# Patient Record
Sex: Female | Born: 1935 | Race: White | Hispanic: No | Smoking: Former smoker
Health system: Southern US, Community
[De-identification: ages and names within clinical notes are randomized; demographics above are authoritative.]

## PROBLEM LIST (undated history)

## (undated) DIAGNOSIS — F0390 Unspecified dementia without behavioral disturbance: Secondary | ICD-10-CM

## (undated) DIAGNOSIS — C50919 Malignant neoplasm of unspecified site of unspecified female breast: Secondary | ICD-10-CM

## (undated) DIAGNOSIS — B1910 Unspecified viral hepatitis B without hepatic coma: Secondary | ICD-10-CM

## (undated) DIAGNOSIS — E039 Hypothyroidism, unspecified: Secondary | ICD-10-CM

## (undated) DIAGNOSIS — E785 Hyperlipidemia, unspecified: Secondary | ICD-10-CM

## (undated) DIAGNOSIS — S32000A Wedge compression fracture of unspecified lumbar vertebra, initial encounter for closed fracture: Secondary | ICD-10-CM

## (undated) DIAGNOSIS — G459 Transient cerebral ischemic attack, unspecified: Secondary | ICD-10-CM

## (undated) DIAGNOSIS — J449 Chronic obstructive pulmonary disease, unspecified: Secondary | ICD-10-CM

## (undated) DIAGNOSIS — E079 Disorder of thyroid, unspecified: Secondary | ICD-10-CM

## (undated) HISTORY — PX: WRIST SURGERY: SHX841

## (undated) HISTORY — PX: APPENDECTOMY: SHX54

## (undated) HISTORY — PX: MASTECTOMY: SHX3

## (undated) SURGERY — Surgical Case
Anesthesia: *Unknown

---

## 1998-07-21 ENCOUNTER — Other Ambulatory Visit: Admission: RE | Admit: 1998-07-21 | Discharge: 1998-07-21 | Payer: Self-pay | Admitting: *Deleted

## 1998-08-18 ENCOUNTER — Ambulatory Visit (HOSPITAL_COMMUNITY): Admission: RE | Admit: 1998-08-18 | Discharge: 1998-08-18 | Payer: Self-pay | Admitting: *Deleted

## 1998-10-03 ENCOUNTER — Ambulatory Visit (HOSPITAL_BASED_OUTPATIENT_CLINIC_OR_DEPARTMENT_OTHER): Admission: RE | Admit: 1998-10-03 | Discharge: 1998-10-03 | Payer: Self-pay | Admitting: Ophthalmology

## 1999-07-19 ENCOUNTER — Other Ambulatory Visit: Admission: RE | Admit: 1999-07-19 | Discharge: 1999-07-19 | Payer: Self-pay | Admitting: *Deleted

## 2000-07-25 ENCOUNTER — Ambulatory Visit (HOSPITAL_COMMUNITY): Admission: RE | Admit: 2000-07-25 | Discharge: 2000-07-25 | Payer: Self-pay | Admitting: Endocrinology

## 2000-08-07 ENCOUNTER — Other Ambulatory Visit: Admission: RE | Admit: 2000-08-07 | Discharge: 2000-08-07 | Payer: Self-pay | Admitting: *Deleted

## 2001-08-19 ENCOUNTER — Other Ambulatory Visit: Admission: RE | Admit: 2001-08-19 | Discharge: 2001-08-19 | Payer: Self-pay | Admitting: *Deleted

## 2003-03-15 ENCOUNTER — Other Ambulatory Visit: Admission: RE | Admit: 2003-03-15 | Discharge: 2003-03-15 | Payer: Self-pay | Admitting: Obstetrics and Gynecology

## 2009-05-30 ENCOUNTER — Encounter: Admission: RE | Admit: 2009-05-30 | Discharge: 2009-05-30 | Payer: Self-pay | Admitting: Endocrinology

## 2009-11-19 ENCOUNTER — Emergency Department (HOSPITAL_BASED_OUTPATIENT_CLINIC_OR_DEPARTMENT_OTHER): Admission: EM | Admit: 2009-11-19 | Discharge: 2009-11-19 | Payer: Self-pay | Admitting: Emergency Medicine

## 2009-11-19 ENCOUNTER — Ambulatory Visit: Payer: Self-pay | Admitting: Diagnostic Radiology

## 2010-05-24 ENCOUNTER — Ambulatory Visit: Payer: Self-pay | Admitting: Diagnostic Radiology

## 2010-05-24 ENCOUNTER — Emergency Department (HOSPITAL_BASED_OUTPATIENT_CLINIC_OR_DEPARTMENT_OTHER): Admission: EM | Admit: 2010-05-24 | Discharge: 2010-05-24 | Payer: Self-pay | Admitting: Emergency Medicine

## 2010-06-07 ENCOUNTER — Inpatient Hospital Stay (HOSPITAL_COMMUNITY): Admission: EM | Admit: 2010-06-07 | Discharge: 2010-06-15 | Payer: Self-pay | Admitting: Emergency Medicine

## 2011-01-12 LAB — URINE MICROSCOPIC-ADD ON

## 2011-01-12 LAB — APTT: aPTT: 23 seconds — ABNORMAL LOW (ref 24–37)

## 2011-01-12 LAB — CBC
Hemoglobin: 13.1 g/dL (ref 12.0–15.0)
MCH: 31.4 pg (ref 26.0–34.0)
MCV: 93.7 fL (ref 78.0–100.0)
Platelets: 166 10*3/uL (ref 150–400)
RBC: 3.79 MIL/uL — ABNORMAL LOW (ref 3.87–5.11)
RBC: 4.15 MIL/uL (ref 3.87–5.11)
RDW: 13.9 % (ref 11.5–15.5)
WBC: 11.6 10*3/uL — ABNORMAL HIGH (ref 4.0–10.5)
WBC: 6.9 10*3/uL (ref 4.0–10.5)

## 2011-01-12 LAB — COMPREHENSIVE METABOLIC PANEL
Albumin: 3.1 g/dL — ABNORMAL LOW (ref 3.5–5.2)
Alkaline Phosphatase: 53 U/L (ref 39–117)
Calcium: 8.5 mg/dL (ref 8.4–10.5)
Glucose, Bld: 89 mg/dL (ref 70–99)
Sodium: 144 mEq/L (ref 135–145)

## 2011-01-12 LAB — URINALYSIS, ROUTINE W REFLEX MICROSCOPIC
Hgb urine dipstick: NEGATIVE
Nitrite: NEGATIVE
Protein, ur: NEGATIVE mg/dL
Specific Gravity, Urine: 1.024 (ref 1.005–1.030)
Urobilinogen, UA: 1 mg/dL (ref 0.0–1.0)

## 2011-01-12 LAB — DIFFERENTIAL
Eosinophils Absolute: 0.1 10*3/uL (ref 0.0–0.7)
Eosinophils Relative: 1 % (ref 0–5)
Lymphocytes Relative: 11 % — ABNORMAL LOW (ref 12–46)
Lymphs Abs: 1.3 10*3/uL (ref 0.7–4.0)
Monocytes Absolute: 0.6 10*3/uL (ref 0.1–1.0)

## 2011-01-12 LAB — BASIC METABOLIC PANEL
BUN: 19 mg/dL (ref 6–23)
CO2: 23 mEq/L (ref 19–32)
Chloride: 110 mEq/L (ref 96–112)
Glucose, Bld: 107 mg/dL — ABNORMAL HIGH (ref 70–99)
Potassium: 3.7 mEq/L (ref 3.5–5.1)

## 2011-01-12 LAB — POCT CARDIAC MARKERS
CKMB, poc: <1 ng/mL — ABNORMAL LOW (ref 1.0–8.0)
Troponin i, poc: <0.05 ng/mL (ref 0.00–0.09)

## 2011-01-15 LAB — CBC
HCT: 41.2 % (ref 36.0–46.0)
Platelets: 161 10*3/uL (ref 150–400)
WBC: 11 10*3/uL — ABNORMAL HIGH (ref 4.0–10.5)

## 2011-01-15 LAB — DIFFERENTIAL
Basophils Absolute: 0.1 10*3/uL (ref 0.0–0.1)
Basophils Relative: 1 % (ref 0–1)
Eosinophils Absolute: 0 10*3/uL (ref 0.0–0.7)
Eosinophils Relative: 0 % (ref 0–5)
Monocytes Absolute: 0.2 10*3/uL (ref 0.1–1.0)

## 2011-01-15 LAB — URINALYSIS, ROUTINE W REFLEX MICROSCOPIC
Glucose, UA: NEGATIVE mg/dL
Hgb urine dipstick: NEGATIVE
Urobilinogen, UA: 1 mg/dL (ref 0.0–1.0)
pH: 6 (ref 5.0–8.0)

## 2011-01-15 LAB — COMPREHENSIVE METABOLIC PANEL
ALT: 29 U/L (ref 0–35)
Albumin: 4.4 g/dL (ref 3.5–5.2)
Alkaline Phosphatase: 54 U/L (ref 39–117)
BUN: 17 mg/dL (ref 6–23)
Chloride: 109 mEq/L (ref 96–112)
Potassium: 4.2 mEq/L (ref 3.5–5.1)
Sodium: 143 mEq/L (ref 135–145)
Total Bilirubin: 0.5 mg/dL (ref 0.3–1.2)
Total Protein: 7.3 g/dL (ref 6.0–8.3)

## 2011-01-15 LAB — LIPASE, BLOOD: Lipase: 87 U/L (ref 23–300)

## 2011-07-30 ENCOUNTER — Emergency Department (HOSPITAL_BASED_OUTPATIENT_CLINIC_OR_DEPARTMENT_OTHER)
Admission: EM | Admit: 2011-07-30 | Discharge: 2011-07-30 | Disposition: A | Discharge status: Home / Self Care | Payer: Medicare Other | Attending: Emergency Medicine | Admitting: Emergency Medicine

## 2011-07-30 ENCOUNTER — Emergency Department (INDEPENDENT_AMBULATORY_CARE_PROVIDER_SITE_OTHER): Payer: Medicare Other

## 2011-07-30 DIAGNOSIS — E079 Disorder of thyroid, unspecified: Secondary | ICD-10-CM | POA: Insufficient documentation

## 2011-07-30 DIAGNOSIS — S32009A Unspecified fracture of unspecified lumbar vertebra, initial encounter for closed fracture: Secondary | ICD-10-CM | POA: Insufficient documentation

## 2011-07-30 DIAGNOSIS — E785 Hyperlipidemia, unspecified: Secondary | ICD-10-CM | POA: Insufficient documentation

## 2011-07-30 DIAGNOSIS — F17200 Nicotine dependence, unspecified, uncomplicated: Secondary | ICD-10-CM | POA: Insufficient documentation

## 2011-07-30 DIAGNOSIS — M549 Dorsalgia, unspecified: Secondary | ICD-10-CM

## 2011-07-30 DIAGNOSIS — M949 Disorder of cartilage, unspecified: Secondary | ICD-10-CM

## 2011-07-30 DIAGNOSIS — Q7649 Other congenital malformations of spine, not associated with scoliosis: Secondary | ICD-10-CM

## 2011-07-30 DIAGNOSIS — M8440XA Pathological fracture, unspecified site, initial encounter for fracture: Secondary | ICD-10-CM

## 2011-07-30 DIAGNOSIS — X58XXXA Exposure to other specified factors, initial encounter: Secondary | ICD-10-CM | POA: Insufficient documentation

## 2011-07-30 DIAGNOSIS — S32030A Wedge compression fracture of third lumbar vertebra, initial encounter for closed fracture: Secondary | ICD-10-CM

## 2011-07-30 HISTORY — DX: Malignant neoplasm of unspecified site of unspecified female breast: C50.919

## 2011-07-30 HISTORY — DX: Disorder of thyroid, unspecified: E07.9

## 2011-07-30 HISTORY — DX: Hyperlipidemia, unspecified: E78.5

## 2011-07-30 LAB — BASIC METABOLIC PANEL
CO2: 27 mEq/L (ref 19–32)
Calcium: 9.7 mg/dL (ref 8.4–10.5)
Chloride: 105 mEq/L (ref 96–112)
Sodium: 142 mEq/L (ref 135–145)

## 2011-07-30 LAB — URINALYSIS, ROUTINE W REFLEX MICROSCOPIC
Glucose, UA: NEGATIVE mg/dL
Protein, ur: NEGATIVE mg/dL

## 2011-07-30 LAB — URINE MICROSCOPIC-ADD ON

## 2011-07-30 MED ORDER — ACETAMINOPHEN 325 MG PO TABS
650.0000 mg | ORAL_TABLET | Freq: Once | ORAL | Status: AC
  Administered 2011-07-30: 650 mg via ORAL
  Filled 2011-07-30: qty 2

## 2011-07-30 MED ORDER — HYDROCODONE-ACETAMINOPHEN 5-325 MG PO TABS: 1.0000 | ORAL_TABLET | Freq: Four times a day (QID) | ORAL | Status: AC | PRN

## 2011-07-30 NOTE — ED Notes (Signed)
Pt assisted to BR via w/c for urine specimen.

## 2011-07-30 NOTE — ED Notes (Signed)
MD at bedside. 

## 2011-07-30 NOTE — ED Notes (Signed)
Pt reports low back pain that started this am after awakening.

## 2011-07-30 NOTE — ED Provider Notes (Signed)
History     CSN: [id number] Arrival date & time: 07/30/2011  8:52 AM  Chief Complaint  Patient presents with  . Back Pain    (Consider location/radiation/quality/duration/timing/severity/associated sxs/prior treatment) HPI Complains of low back pain radiating to right flank onset this morning sharp in quality worse with movement improved with rest no other associated symptoms no loss of bladder or bowel control no fever no trauma Past Medical History  Diagnosis Date  . Hyperlipemia   . Thyroid disease   . Breast cancer   . Kidney stones     Past Surgical History  Procedure Date  . Mastectomy     No family history on file.  History  Substance Use Topics  . Smoking status: Current Everyday Smoker -- 1.0 packs/day  . Smokeless tobacco: Never Used  . Alcohol Use: No   retired Engineer, civil (consulting)  OB History    Grav Para Term Preterm Abortions TAB SAB Ect Mult Living                  Review of Systems  Constitutional: Negative.   HENT: Negative.   Respiratory: Negative.   Cardiovascular: Negative.   Gastrointestinal: Negative.   Musculoskeletal: Positive for back pain.       Walks with walker  Skin: Negative.   Neurological: Negative.   Hematological: Negative.   Psychiatric/Behavioral: Negative.     Allergies  Codeine  Home Medications   Current Outpatient Rx  Name Route Sig Dispense Refill  . LEVOTHYROXINE SODIUM 100 MCG PO TABS Oral Take by mouth daily.      Marland Kitchen SIMVASTATIN 40 MG PO TABS Oral Take 40 mg by mouth at bedtime.        BP 112/66  Pulse 71  Temp(Src) 98.3 F (36.8 C) (Oral)  Resp 16  Ht 5\' 2"  (1.575 m)  Wt 110 lb (49.896 kg)  BMI 20.12 kg/m2  SpO2 98%  Physical Exam  Nursing note and vitals reviewed. Constitutional: She appears well-developed and well-nourished.  HENT:  Head: Normocephalic and atraumatic.  Eyes: Conjunctivae are normal. Pupils are equal, round, and reactive to light.  Neck: Neck supple. No tracheal deviation present. No  thyromegaly present.  Cardiovascular: Normal rate and regular rhythm.   No murmur heard. Pulmonary/Chest: Effort normal and breath sounds normal.  Abdominal: Soft. Bowel sounds are normal. She exhibits no distension. There is no tenderness.  Musculoskeletal: Normal range of motion. She exhibits no edema and no tenderness.       Entire spine nontender pain at right sided paralumbar area when she sits up from a supine position  Neurological: She is alert. She displays normal reflexes. Coordination normal.       Walks with minimal assistance  Skin: Skin is warm and dry. No rash noted.  Psychiatric: She has a normal mood and affect.    ED Course  Procedures (including critical care time)   Labs Reviewed  BASIC METABOLIC PANEL  URINALYSIS, ROUTINE W REFLEX MICROSCOPIC   No results found.   No diagnosis found.  Patient states pain improved after treatment with Tylenol urine culture pending.  MDM  X-rays read by radiologist, reviewed by me. New compression fracture L3  Plan prescription hydrocodone-A. Pap ALT with Dr.Altheimer as needed  Diagnosis compression fracture L3    Doug Sou, MD 07/30/11 1004

## 2011-07-31 LAB — URINE CULTURE: Colony Count: 4000

## 2011-08-29 ENCOUNTER — Encounter (HOSPITAL_BASED_OUTPATIENT_CLINIC_OR_DEPARTMENT_OTHER): Payer: Self-pay | Admitting: *Deleted

## 2011-08-29 ENCOUNTER — Inpatient Hospital Stay (HOSPITAL_COMMUNITY)
Admission: AD | Admit: 2011-08-29 | Discharge: 2011-09-02 | DRG: 517 | Disposition: A | Discharge status: Home / Self Care | Payer: Medicare Other | Source: Other Acute Inpatient Hospital | Attending: Family Medicine | Admitting: Family Medicine

## 2011-08-29 ENCOUNTER — Emergency Department (INDEPENDENT_AMBULATORY_CARE_PROVIDER_SITE_OTHER): Payer: Medicare Other

## 2011-08-29 ENCOUNTER — Emergency Department (HOSPITAL_BASED_OUTPATIENT_CLINIC_OR_DEPARTMENT_OTHER)
Admission: EM | Admit: 2011-08-29 | Discharge: 2011-08-29 | Disposition: A | Discharge status: Other Acute Inpatient Hospital | Payer: Medicare Other | Source: Home / Self Care | Attending: Emergency Medicine | Admitting: Emergency Medicine

## 2011-08-29 DIAGNOSIS — Z853 Personal history of malignant neoplasm of breast: Secondary | ICD-10-CM | POA: Insufficient documentation

## 2011-08-29 DIAGNOSIS — R8271 Bacteriuria: Secondary | ICD-10-CM

## 2011-08-29 DIAGNOSIS — M8448XA Pathological fracture, other site, initial encounter for fracture: Secondary | ICD-10-CM

## 2011-08-29 DIAGNOSIS — E079 Disorder of thyroid, unspecified: Secondary | ICD-10-CM | POA: Insufficient documentation

## 2011-08-29 DIAGNOSIS — M545 Low back pain, unspecified: Secondary | ICD-10-CM

## 2011-08-29 DIAGNOSIS — W19XXXA Unspecified fall, initial encounter: Secondary | ICD-10-CM | POA: Insufficient documentation

## 2011-08-29 DIAGNOSIS — F17200 Nicotine dependence, unspecified, uncomplicated: Secondary | ICD-10-CM | POA: Insufficient documentation

## 2011-08-29 DIAGNOSIS — M810 Age-related osteoporosis without current pathological fracture: Secondary | ICD-10-CM

## 2011-08-29 DIAGNOSIS — IMO0002 Reserved for concepts with insufficient information to code with codable children: Secondary | ICD-10-CM

## 2011-08-29 DIAGNOSIS — E785 Hyperlipidemia, unspecified: Secondary | ICD-10-CM

## 2011-08-29 DIAGNOSIS — S32009A Unspecified fracture of unspecified lumbar vertebra, initial encounter for closed fracture: Secondary | ICD-10-CM | POA: Insufficient documentation

## 2011-08-29 DIAGNOSIS — E039 Hypothyroidism, unspecified: Secondary | ICD-10-CM

## 2011-08-29 DIAGNOSIS — M8000XA Age-related osteoporosis with current pathological fracture, unspecified site, initial encounter for fracture: Secondary | ICD-10-CM

## 2011-08-29 DIAGNOSIS — Z720 Tobacco use: Secondary | ICD-10-CM

## 2011-08-29 DIAGNOSIS — J449 Chronic obstructive pulmonary disease, unspecified: Secondary | ICD-10-CM

## 2011-08-29 DIAGNOSIS — J438 Other emphysema: Secondary | ICD-10-CM | POA: Diagnosis present

## 2011-08-29 DIAGNOSIS — S32030A Wedge compression fracture of third lumbar vertebra, initial encounter for closed fracture: Secondary | ICD-10-CM

## 2011-08-29 DIAGNOSIS — K5900 Constipation, unspecified: Secondary | ICD-10-CM | POA: Diagnosis present

## 2011-08-29 LAB — URINALYSIS, ROUTINE W REFLEX MICROSCOPIC
Leukocytes, UA: NEGATIVE
Nitrite: NEGATIVE
Protein, ur: NEGATIVE mg/dL
Urobilinogen, UA: 1 mg/dL (ref 0.0–1.0)

## 2011-08-29 LAB — COMPREHENSIVE METABOLIC PANEL
ALT: 13 U/L (ref 0–35)
AST: 24 U/L (ref 0–37)
Albumin: 3.5 g/dL (ref 3.5–5.2)
CO2: 27 mEq/L (ref 19–32)
Calcium: 8.9 mg/dL (ref 8.4–10.5)
Chloride: 105 mEq/L (ref 96–112)
GFR calc non Af Amer: >90 mL/min (ref >90)
Sodium: 142 mEq/L (ref 135–145)
Total Bilirubin: 0.5 mg/dL (ref 0.3–1.2)

## 2011-08-29 LAB — DIFFERENTIAL
Basophils Absolute: 0.1 10*3/uL (ref 0.0–0.1)
Basophils Relative: 1 % (ref 0–1)
Lymphocytes Relative: 16 % (ref 12–46)
Neutro Abs: 6 10*3/uL (ref 1.7–7.7)

## 2011-08-29 LAB — CBC
Platelets: 161 10*3/uL (ref 150–400)
RDW: 13.1 % (ref 11.5–15.5)
WBC: 8.1 10*3/uL (ref 4.0–10.5)

## 2011-08-29 MED ORDER — FENTANYL CITRATE 0.05 MG/ML IJ SOLN
50.0000 ug | Freq: Once | INTRAMUSCULAR | Status: AC
  Administered 2011-08-29: 50 ug via INTRAVENOUS
  Filled 2011-08-29: qty 2

## 2011-08-29 MED ORDER — FENTANYL CITRATE 0.05 MG/ML IJ SOLN
INTRAMUSCULAR | Status: AC
  Filled 2011-08-29: qty 2

## 2011-08-29 MED ORDER — FENTANYL CITRATE 0.05 MG/ML IJ SOLN
50.0000 ug | Freq: Once | INTRAMUSCULAR | Status: AC
  Administered 2011-08-29: 50 ug via INTRAVENOUS

## 2011-08-29 MED ORDER — POTASSIUM CHLORIDE 20 MEQ/15ML (10%) PO LIQD
40.0000 meq | Freq: Once | ORAL | Status: AC
  Administered 2011-08-29: 40 meq via ORAL
  Filled 2011-08-29: qty 30

## 2011-08-29 MED ORDER — SODIUM CHLORIDE 0.9 % IV SOLN
INTRAVENOUS | Status: AC
  Administered 2011-08-29: 13:00:00 via INTRAVENOUS

## 2011-08-29 MED ORDER — SODIUM CHLORIDE 0.9 % IV SOLN: INTRAVENOUS | Status: DC

## 2011-08-29 NOTE — ED Notes (Signed)
Patient family member found me in hall, wanted help getting patient onto bed pan again. I helped patient again, but patient not able to go, then she wanted pain meds again, I advised will notify nurse. I notified nurse. Then family member notified me of patient need to urinate again. I set up a bedside toilet, then helped patient to sitting position, then helped her back into bed. Patient urinated with bedside toilet. Patient's family member reminded me a need for pain meds .

## 2011-08-29 NOTE — ED Notes (Signed)
Pt placed on bed pan per her request voided 150 cc cloudy yellow urine

## 2011-08-29 NOTE — ED Notes (Signed)
Pt report given Renae Fickle, RN with [facility].

## 2011-08-29 NOTE — ED Notes (Signed)
IV inserted by [facility], 20 gauge to left lower arm prior to transport.

## 2011-08-29 NOTE — ED Notes (Signed)
Larey Seat oct 1st seen and treated here having worsening back pain daughter is concerned that she may have fallen again and re-injured no pain meds left has only been taking ibuprofen. Daughter is concerned she may be dehydrated as she has decreased appetite and doesn't even want to drink her coffee. Per daughter pt is "obsessed with cigarettes" and doesn't want to do anything but smoke. Pt has dementia and family is concerned that she isnt caring for herself well per daughter has found cigarettes half smoked and in clothes drawers.

## 2011-08-29 NOTE — ED Notes (Signed)
Patient family called me in to help patient onto bedpan. I helped remove patient's sweat pants and underwear. I positioned patient on bedpan, waited for her to void, then removed. Family member at bedside continued to help but was more a hinderence than help. I took new set of vitals, repositioned patient in bed, applied more pillows to eliviate back pain complaint. I notified nurse of same and notified of patient request for pain meds.

## 2011-08-29 NOTE — ED Notes (Signed)
Pt report given to Inetta Fermo, Charity fundraiser on [address] at [facility]

## 2011-08-29 NOTE — ED Notes (Signed)
Bed assignment given to pt's family and transfer process explained. Pt informed that her pain meds are only due every 3 hours and it is not time yet for another dose, family understands MD orders.

## 2011-08-29 NOTE — ED Provider Notes (Signed)
History     CSN: [id number] Arrival date & time: 08/29/2011 11:37 AM   First MD Initiated Contact with Patient 08/29/11 1209      Chief Complaint  Patient presents with  . Back Pain    (Consider location/radiation/quality/duration/timing/severity/associated sxs/prior treatment) HPI Comments: Patient is an 77 year old woman who complains of back pain. 4 weeks ago she had fallen and had suffered a compression fracture of L3. Her pain seemed to worsen the couple of days ago. There was no new injury. She has been taking hydrocodone and ibuprofen for the pain.  Patient is a 77 y.o. female presenting with back pain.  Back Pain  This is a recurrent problem. The current episode started more than 1 week ago. The problem occurs constantly. The problem has been gradually worsening. The pain is associated with falling. The pain is present in the lumbar spine. The quality of the pain is described as aching. The pain does not radiate. The pain is at a severity of 4/10. The pain is moderate. Exacerbated by: Her pain is worse with standing and walking. Associated symptoms include a fever. She has tried NSAIDs and analgesics (She gets transient relief with hydrocodone-acetaminophen and ibuprofen.) for the symptoms.    Past Medical History  Diagnosis Date  . Hyperlipemia   . Thyroid disease   . Breast cancer   . Kidney stones     Past Surgical History  Procedure Date  . Mastectomy     History reviewed. No pertinent family history.  History  Substance Use Topics  . Smoking status: Current Everyday Smoker -- 1.0 packs/day  . Smokeless tobacco: Never Used  . Alcohol Use: No    OB History    Grav Para Term Preterm Abortions TAB SAB Ect Mult Living                  Review of Systems  Constitutional: Positive for fever. Negative for chills.  HENT: Negative.   Eyes: Negative.   Respiratory: Negative.   Cardiovascular: Negative.   Gastrointestinal: Negative.   Musculoskeletal: Positive  for back pain.  Neurological: Negative.   Psychiatric/Behavioral: Negative.     Allergies  Review of patient's allergies indicates no active allergies.  Home Medications   No current outpatient prescriptions on file.  BP 150/86  Pulse 71  Temp(Src) 97.7 F (36.5 C) (Oral)  Resp 20  SpO2 95%  Physical Exam  Constitutional:       Frail appearing elderly woman, appears chronically ill.    HENT:  Head: Normocephalic and atraumatic.  Right Ear: External ear normal.  Left Ear: External ear normal.  Eyes: Conjunctivae and EOM are normal. Pupils are equal, round, and reactive to light.  Neck: Normal range of motion. Neck supple.  Cardiovascular: Normal rate and regular rhythm.   Pulmonary/Chest: Effort normal.       She has distant breath sounds bilaterally  Abdominal: Soft. Bowel sounds are normal.  Musculoskeletal:       She localizes pain to the lumbar region. There is no palpable deformity. She has tenderness to touch in the midline over the lumbar spine.  Skin: Skin is warm and dry.  Psychiatric: She has a normal mood and affect. Her behavior is normal.    ED Course  Procedures (including critical care time)  Labs Reviewed  URINALYSIS, ROUTINE W REFLEX MICROSCOPIC - Abnormal; Notable for the following:    Appearance CLOUDY (*)    Ketones, ur 15 (*)    All other components within  normal limits  CBC - Abnormal; Notable for the following:    RBC 3.69 (*)    Hemoglobin 11.7 (*)    HCT 34.4 (*)    All other components within normal limits  COMPREHENSIVE METABOLIC PANEL - Abnormal; Notable for the following:    Potassium 2.8 (*)    Creatinine, Ser 0.40 (*)    All other components within normal limits  DIFFERENTIAL  URINE CULTURE    Date: 08/29/2011  Rate: 69  Rhythm: normal sinus rhythm  QRS Axis: normal  Intervals: normal  ST/T Wave abnormalities: nonspecific ST/T changes  Conduction Disutrbances:none  Narrative Interpretation:   Old EKG Reviewed:  unchanged  10:09 AM Patient's potassium is low at 2.8. Oral potassium will be ordered. She has a worsened compression fracture of L3. CT of the lumbar spine was ordered to further assess back.  10:09 AM CT of lumbar spine shows compression fracture of L3 with 50% compromise of the spinal canal.   10:09 AM Case discussed with Barnett Abu, M.D.,  On call for neurosurgery.  He reviewed her films and will be glad to consult on her.  He requests that she be admitted by [facility].  10:09 AM Pt;s case discussed with Dr. Olegario Messier, who accepts pt for transfer to [facility] to [facility] Team 6.   1. Compression fracture of L3 lumbar vertebra           Carleene Cooper III, MD 08/30/11 1009

## 2011-08-30 LAB — TSH: TSH: 11.768 u[IU]/mL — ABNORMAL HIGH (ref 0.350–4.500)

## 2011-08-31 ENCOUNTER — Inpatient Hospital Stay (HOSPITAL_COMMUNITY): Payer: Medicare Other

## 2011-08-31 LAB — BASIC METABOLIC PANEL
BUN: 10 mg/dL (ref 6–23)
CO2: 23 mEq/L (ref 19–32)
Chloride: 110 mEq/L (ref 96–112)
Glucose, Bld: 97 mg/dL (ref 70–99)
Potassium: 4.2 mEq/L (ref 3.5–5.1)

## 2011-08-31 LAB — CBC
HCT: 37.4 % (ref 36.0–46.0)
Hemoglobin: 12.6 g/dL (ref 12.0–15.0)
MCHC: 33.7 g/dL (ref 30.0–36.0)
RBC: 3.92 MIL/uL (ref 3.87–5.11)
WBC: 9 10*3/uL (ref 4.0–10.5)

## 2011-08-31 LAB — URINE CULTURE: Culture  Setup Time: 201210311819

## 2011-08-31 NOTE — H&P (Signed)
Emily Bautista, Emily Bautista NO.:  [id number]  MEDICAL RECORD NO.:  [id number]  LOCATION:  5024                         FACILITY:  [facility]  PHYSICIAN:  Houston Siren, MD           DATE OF BIRTH:  [birth year]  DATE OF ADMISSION:  08/29/2011 DATE OF DISCHARGE:                             HISTORY & PHYSICAL   PRIMARY CARE PHYSICIAN:  None.  ADVANCED DIRECTIVE:  Full code.  This was reconfirmed tonight.  REASON FOR ADMISSION:  Lumbar compressive fracture with retropulsion and increased back pain.  HISTORY OF PRESENT ILLNESS:  This is an 77 year old female, retirement home resident, with history of hypothyroidism, hypercholesterolemia, history of left distal wrist and left patella fractures, fell earlier this month and had an L3 along with an L1 compressive fractures, presents to [facility] Emergency Room with increasing back pain.  Workup there included a lumbosacral CAT scan which showed retropulsion of L3 with 50% spinal canal compromise.  She denies any lower extremity weakness, bowel or bladder incontinence.  She does have increased pain but denied any nausea, vomiting, fever, or chills.  Dr. Danielle Dess of Neurosurgery was consulted by Dr. Earlene Plater and had recommended that she be admitted for observation, to control pain, and that he will see her in consultation tomorrow.  Other workup is also significant for hypokalemia with serum potassium of 2.8.  She has no other complaints.  PAST MEDICAL HISTORY:  Frequent fall risk and patellar fracture, hypothyroidism, hypercholesterolemia, osteoporosis.  SOCIAL HISTORY:  She lives in a retirement home.  She denied tobacco, alcohol, or drug use.  MEDICATIONS:  Vitamin, Vytorin 10-40, Synthroid 88 mcg, aspirin 325 mg.  REVIEW OF SYSTEMS:  Otherwise unremarkable.  PHYSICAL EXAM:  VITAL SIGNS:  Blood pressure is 140/66, heart rate of 70. GENERAL:  She is alert and oriented and conversant.  She has facial symmetry and fluent  speech.  Tongue is midline. NECK:  Supple.  No carotid bruit. CARDIAC:  S1, S2 regular.  I did not hear any murmur, rub, or gallop. LUNGS:  Clear. ABDOMEN:  Soft, nondistended, nontender.  Palpation of her spinous process elicit slight tenderness. EXTREMITIES:  She is able to lift both lower extremities with good strength.  Normal deep tendon reflex.  No sensory loss.  No calf tenderness. SKIN:  Warm and dry.  OBJECTIVE FINDINGS:  Serum sodium 142, potassium 2.8, glucose of 91, calcium of 8.9.  Urinalysis is negative for any evidence of infection. White count of 8.1000, hemoglobin of 11.7, platelet count of 161,000. Chest x-ray showed emphysema but otherwise unremarkable.  CT scan showed severe compression fracture of L3 with vertebra plana deformity.  There is retropulsion and 50% canal compromised along with osteoporosis and remote L1 compression fracture.  IMPRESSION:  This is an 77 year old female admitted for progression of her L3 compressive fracture with retropulsion compromising her spinal canal estimated to be about 50%.  She will be at bedrest and will be given pain medication.  Dr. Danielle Dess of Neurosurgery will have formal consultation to see if anything needed to be done.  I suspect that if no surgical procedure needed to be done, she still  likely will need short-term rehab.  I have requested a social service consult for that.  She is stable otherwise and is a full code.  We will admit her to [facility] team 6. I will give her potassium supplements.     Houston Siren, MD     PL/MEDQ  D:  08/29/2011  T:  08/30/2011  Job:  [id number]  Electronically Signed by Houston Siren  on 08/31/2011 03:55:20 AM

## 2011-09-01 MED ORDER — LEVOTHYROXINE SODIUM 88 MCG PO TABS
88.0000 ug | ORAL_TABLET | Freq: Every day | ORAL | Status: DC
  Administered 2011-09-02: 88 ug via ORAL
  Filled 2011-09-01 (×2): qty 1

## 2011-09-01 MED ORDER — KETOROLAC TROMETHAMINE 15 MG/ML IJ SOLN
15.0000 mg | Freq: Four times a day (QID) | INTRAMUSCULAR | Status: DC | PRN
  Filled 2011-09-01: qty 1

## 2011-09-01 MED ORDER — SODIUM CHLORIDE 0.9 % IJ SOLN: 3.0000 mL | Freq: Two times a day (BID) | INTRAMUSCULAR | Status: DC

## 2011-09-01 MED ORDER — ACETAMINOPHEN 325 MG PO TABS: 650.0000 mg | ORAL_TABLET | ORAL | Status: DC | PRN

## 2011-09-01 MED ORDER — CALCIUM CARBONATE 1250 (500 CA) MG PO TABS
1.0000 | ORAL_TABLET | Freq: Three times a day (TID) | ORAL | Status: DC
  Administered 2011-09-02: 500 mg via ORAL
  Filled 2011-09-01 (×5): qty 1

## 2011-09-01 MED ORDER — SODIUM CHLORIDE 0.9 % IV SOLN: INTRAVENOUS | Status: DC

## 2011-09-01 MED ORDER — HYDROMORPHONE HCL PF 1 MG/ML IJ SOLN: 0.5000 mg | INTRAMUSCULAR | Status: DC | PRN

## 2011-09-01 MED ORDER — HYDROCODONE-ACETAMINOPHEN 5-325 MG PO TABS: 1.0000 | ORAL_TABLET | ORAL | Status: DC | PRN

## 2011-09-01 MED ORDER — DIAZEPAM 5 MG PO TABS
5.0000 mg | ORAL_TABLET | Freq: Four times a day (QID) | ORAL | Status: DC | PRN
  Administered 2011-09-02: 5 mg via ORAL
  Filled 2011-09-01: qty 1

## 2011-09-01 MED ORDER — ALUM & MAG HYDROXIDE-SIMETH 400-400-40 MG/5ML PO SUSP
30.0000 mL | Freq: Four times a day (QID) | ORAL | Status: DC | PRN
  Filled 2011-09-01: qty 30

## 2011-09-01 MED ORDER — OXYCODONE-ACETAMINOPHEN 5-325 MG PO TABS: 1.0000 | ORAL_TABLET | ORAL | Status: DC | PRN

## 2011-09-01 MED ORDER — NICOTINE 7 MG/24HR TD PT24
7.0000 mg | MEDICATED_PATCH | Freq: Every day | TRANSDERMAL | Status: DC
  Administered 2011-09-02: 7 mg via TRANSDERMAL
  Filled 2011-09-01: qty 1

## 2011-09-01 MED ORDER — FENTANYL CITRATE 0.05 MG/ML IJ SOLN: 50.0000 ug | INTRAMUSCULAR | Status: DC | PRN

## 2011-09-01 MED ORDER — ACETAMINOPHEN 325 MG PO TABS
650.0000 mg | ORAL_TABLET | Freq: Four times a day (QID) | ORAL | Status: DC | PRN
  Administered 2011-09-02: 650 mg via ORAL
  Filled 2011-09-01: qty 2

## 2011-09-01 MED ORDER — CALCITONIN (SALMON) 200 UNIT/ACT NA SOLN
1.0000 | Freq: Every day | NASAL | Status: DC
  Administered 2011-09-02: 1 via NASAL

## 2011-09-01 MED ORDER — VITAMIN D3 25 MCG (1000 UNIT) PO TABS
1000.0000 [IU] | ORAL_TABLET | Freq: Every day | ORAL | Status: DC
  Filled 2011-09-01 (×2): qty 1

## 2011-09-01 MED ORDER — EZETIMIBE-SIMVASTATIN 10-40 MG PO TABS
1.0000 | ORAL_TABLET | Freq: Every day | ORAL | Status: DC
  Filled 2011-09-01 (×2): qty 1

## 2011-09-02 DIAGNOSIS — IMO0002 Reserved for concepts with insufficient information to code with codable children: Secondary | ICD-10-CM

## 2011-09-02 DIAGNOSIS — R8271 Bacteriuria: Secondary | ICD-10-CM

## 2011-09-02 DIAGNOSIS — E039 Hypothyroidism, unspecified: Secondary | ICD-10-CM

## 2011-09-02 DIAGNOSIS — Z720 Tobacco use: Secondary | ICD-10-CM

## 2011-09-02 DIAGNOSIS — M8000XA Age-related osteoporosis with current pathological fracture, unspecified site, initial encounter for fracture: Secondary | ICD-10-CM

## 2011-09-02 DIAGNOSIS — E785 Hyperlipidemia, unspecified: Secondary | ICD-10-CM

## 2011-09-02 MED ORDER — ACETAMINOPHEN 325 MG PO TABS: 650.0000 mg | ORAL_TABLET | Freq: Four times a day (QID) | ORAL | Status: AC | PRN

## 2011-09-02 MED ORDER — CHOLECALCIFEROL 25 MCG (1000 UT) PO TABS: 1000.0000 [IU] | ORAL_TABLET | Freq: Every day | ORAL | Status: DC

## 2011-09-02 MED ORDER — CALCIUM CARBONATE 1250 (500 CA) MG PO TABS: 1.0000 | ORAL_TABLET | Freq: Three times a day (TID) | ORAL | Status: DC

## 2011-09-02 MED ORDER — HYDROCODONE-ACETAMINOPHEN 5-500 MG PO TABS: 1.0000 | ORAL_TABLET | Freq: Three times a day (TID) | ORAL | Status: AC | PRN

## 2011-09-02 MED ORDER — CALCITONIN (SALMON) 200 UNIT/ACT NA SOLN: 1.0000 | Freq: Every day | NASAL | Status: AC

## 2011-09-02 NOTE — Consult Note (Signed)
NAMEALLANNA, Bautista NO.:  [id number]  MEDICAL RECORD NO.:  [id number]  LOCATION:  5024                         FACILITY:  [facility]  PHYSICIAN:  Stefani Dama, M.D.  DATE OF BIRTH:  23-Dec-1921  DATE OF CONSULTATION:  08/30/2011 DATE OF DISCHARGE:                                CONSULTATION   REQUESTOR:  [facility], Dr. Lendell Caprice.  REASON FOR REQUEST:  L3 pathologic compression fracture with retropulsed bone.  HISTORY OF PRESENT ILLNESS:  Emily Bautista is an 77 year old white female who had suffered with back pain for a little over a months' time.  Her daughter relates the history that approximately 20 years ago, she was told that she had an osteoporotic compression fracture at the L1 vertebrae, which is indeed the case.  She was started on treatment for osteoporosis and has completed a 10-year course of Fosamax.  She has been off the medication for some period of time about a month ago rather spontaneously lifting something, not particularly heavy and she sustained acute onset of back pain.  An x-ray at that time revealed the presence of a compression fracture of the L3 vertebrae.  No acute therapy was suggested, however, over the past month, the pain has gotten severely worse due to the point that this past week the patient was not able to tolerate even the simplest of activities without excruciating pain.  She was seen at the [facility] where a CT scan demonstrated advancement of the compression fracture of L3 with substantial loss of height and retropulsion of bone into the canal causing a 50% canal compromise.  The patient has some underlying medical problems including emphysema and admitted to the hospital at the current time for medical management and evaluation of therapeutic options for her compression fracture.  PAST MEDICAL HISTORY:  Reveals that generally she has been fairly healthy other than she has a long-standing history of  smoking.  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  PHYSICAL EXAMINATION:  She is an alert, oriented, and cooperative individual, in no obvious distress.  She is resting comfortably in bed. It seems that with weightbearing and any great activity, the pain becomes severely and excruciatingly worse.  Most of the pain is centralized in her low back but she has had radiation of the pain into her lower extremity in the more recent past.  Her motor strength appears good in iliopsoas, quadriceps, tibialis anterior, and gastroc being graded all at 4/5.  Deep tendon reflexes are absent in the patellae and Achilles.  Babinski's were downgoing.  IMPRESSION:  The patient has evidence of severe osteoporosis with a progressive compression fracture of the L3 vertebrae.  I discussed therapeutic options for pain control.  At this point, the patient would not be a candidate for major surgical decompression and stabilization as the surrounding bone is likely also involved with same degree of osteoporosis.  I would, however, suggest an acrylic balloon kyphoplasty in an effort to control her pain.  I discussed the procedure with the patient and her family indicating that this is a surgical procedure fairly brief in nature but we had placed some cement into the lumbar spine in an effort to stabilize the fracture as it is.  This will hopefully allow her to tolerate a bit more activity, so the patient can be out of bed and ambulatory.  I did discuss with them the fact that after a singular fracture, the patient has at least a 20% risk of having another fracture within a years' time.  I am hopeful, however, that we will be able to stabilize this vertebrae so that the patient can become more active and tolerate her activities of  living in a better fashion.  I did discuss with them the risk that if glue does extravasate posteriorly into the spinal canal, our hand may be forced to do some acute surgical decompression of an open nature.  This we can hopefully avoid but the risk is ever present.     Stefani Dama, M.D.     [name]  D:  08/31/2011  T:  08/31/2011  Job:  [id number]  Electronically Signed by Barnett Abu M.D. on 09/02/2011 10:21:32 PM

## 2011-09-02 NOTE — Op Note (Signed)
NAMEDAMEISHA, TSCHIDA NO.:  [id number]  MEDICAL RECORD NO.:  [id number]  LOCATION:  5022                         FACILITY:  [facility]  PHYSICIAN:  Stefani Dama, M.D.  DATE OF BIRTH:  1921-11-20  DATE OF PROCEDURE:  08/31/2011 DATE OF DISCHARGE:                              OPERATIVE REPORT   PREOPERATIVE DIAGNOSIS:  L3 pathologic fracture with retropulsed bone.  POSTOPERATIVE DIAGNOSIS:  L3 pathologic fracture with retropulsed bone.  PROCEDURE:  L3 acrylic balloon kyphoplasty.  SURGEON:  Stefani Dama, MD  ANESTHESIA:  General endotracheal.  INDICATION:  Emily Bautista is an 77 year old individual who for the last month had severe excruciating centralized low back pain.  More recently, she was getting some lumbar radicular symptoms.  She has evidence of an L3 pathologic fracture that has progressed over a month's time, and now has 50% canal compromise.  Because of the patient's advanced age, history of emphysema and poor medical condition, it was felt that a carefully performed kyphoplasty may help abate her pain.  PROCEDURE IN DETAIL:  The patient brought to the operating room supine on the stretcher.  After smooth induction of general endotracheal anesthesia, she was turned prone.  The back was prepped with alcohol and DuraPrep, draped in a sterile fashion.  A fluoroscopic guidance was used to localize L3, this was done on a biplane fashion.  The skin above this area was infiltrated with 1 mL of lidocaine with epinephrine and a small stab incision was created and a Jamshidi needle was placed to the lateral border of the L3 pedicle first on the left side.  This was then guided through the pedicle into the proximal vertebral body.  The inner cannula was removed and a bone drill was placed through this.  The bone was exceedingly soft.  Balloon was then entered into this and inflated to 100 mm of pressure and it was noted that the filling of the  balloon stayed over to the left side only.  For that reason, a similar procedure was carried out to the right side, and a Jamshidi needle place was essentially the same place on the right side, and balloon filling also occurred here.  The vertebral body  was able to be re-expanded approximately 10-15% and cement was prepared at this time allowed to harden to a very thick in consistency.  Then under fluoroscopic guidance, the lateral projection and a total of 7.5 mL of cement was injected into the vertebral body.  Reestablishing good height and filling of the vertebral body, mostly ventrally.  At this point, additional cement of approximately 0.5 mL was not felt to add anything into the stability of the spine and no more injection was completed. The cannulas were carefully removed to make sure that there is no tails of cement protruding from them and final radiographs were obtained.  The skin was closed with 3-0 Vicryl in interrupted fashion.  The patient tolerated procedure well, was returned to recovery room in stable condition.     Stefani Dama, M.D.     [name]  D:  08/31/2011  T:  08/31/2011  Job:  [id number]  Electronically Signed by Sherilyn Cooter  [name] M.D. on 09/02/2011 10:21:35 PM

## 2011-09-02 NOTE — Discharge Planning (Signed)
[facility] Hospital Discharge Summary  Name: Emily Bautista MRN: [id number] DOB: Aug 28, 1922 77 y.o. PCP:  Junious Silk, MD  Date of Admission: 08/29/2011  9:22 PM Date of Discharge: 09/02/2011 Attending Physician: Pleas Koch  Discharge Diagnosis:  LABS-see below  Micro Results: Recent Results (from the past 240 hour(s))  URINE CULTURE     Status: Normal   Collection Time   08/29/11  2:48 PM      Component Value Range Status Comment   Specimen Description URINE, CLEAN CATCH   Final    Special Requests NONE   Final    Setup Time [id number]   Final    Colony Count 45,000 COLONIES/ML   Final    Culture ESCHERICHIA COLI   Final    Report Status 08/31/2011 FINAL   Final    Organism ID, Bacteria ESCHERICHIA COLI   Final     Consultations:   Dr. Luiz Blare of Neruosurgery  Procedures Performed:  Dg Chest 2 View  08/29/2011  *RADIOLOGY REPORT*  Clinical Data: Severe low back pain.  CHEST - 2 VIEW  Comparison: 06/08/2010  Findings: There is hyperinflation of the lungs compatible with COPD.  Heart is upper limits normal in size.  Calcifications in the aorta which appears normal caliber.  No confluent or acute opacities in the lungs.  No effusions.  Compression fractures at L1 and L3 noted as seen on today's lumbar spine series.  IMPRESSION: COPD. No active cardiopulmonary disease.  Original Report Authenticated By: Cyndie Chime, M.D.   Dg Lumbar Spine 2-3 Views Ordered By Jtb - Nor#34  08/31/2011  *RADIOLOGY REPORT*  Clinical Data: 77 year old female undergoing L3 kyphoplasty.  LUMBAR SPINE - 2-3 VIEW  Comparison: Lumbar spine CT 08/29/2011.  Fluoroscopy time of 1.1 minutes was utilized.  Findings: Intraoperative fluoroscopic AP and lateral views of the lower lumbar spine demonstrate bone cement within the severely compressed L3 vertebral body.  A trace amount of cement probably extends into the anterior L2-L3 disc space.  Adjacent L2 and L4 vertebral bodies appears stable.   IMPRESSION: L3 kyphoplasty without adverse features.  Original Report Authenticated By: Harley Hallmark, M.D.   Dg Lumbar Spine Complete  08/29/2011  *RADIOLOGY REPORT*  Clinical Data: Low back pain.  History of compression fracture.  LUMBAR SPINE - COMPLETE 4+ VIEW  Comparison: 07/30/2011  Findings: Stable mild compression fracture L1.  L3 compression fracture has progressed significantly and is now severe.  No new compression fracture.  Diffuse osteopenia.  Normal alignment.  Mild spondylosis throughout the lumbar spine.  SI joints are symmetric and unremarkable.  Visualized bony pelvis is intact.  IMPRESSION: Significant progression of L3 compression fracture, now severe. Stable mild L1 compression fracture.  Original Report Authenticated By: Cyndie Chime, M.D.   Ct Lumbar Spine Wo Contrast  08/29/2011  *RADIOLOGY REPORT*  Clinical Data: Severe back pain.  Progressive compression fracture at L3.  CT LUMBAR SPINE WITHOUT CONTRAST  Technique:  Multidetector CT imaging of the lumbar spine was performed without intravenous contrast administration. Multiplanar CT image reconstructions were also generated.  Comparison: Lumbar spine series from 07/30/2011 and 08/29/2011.  Findings: There is a severe compression fracture of L3 with a vertebral plana deformity.  There is retropulsion and 50% canal compromise.  The pedicles are intact.  No laminar fracture.  The facets are normally aligned.  There is a remote mild compression fracture of L1.  No retropulsion or canal compromise.  There is advanced osteoporosis that the remaining lumbar vertebral bodies  are intact.  A large hemangioma occupies T11.  There is a benign appearing stable sclerotic lesion in the L5 vertebral body.  IMPRESSION:  1.  Severe compression fracture of L3 with a vertebral plana deformity.  There is retropulsion and 50% canal compromise. 2.  Remote L1 compression fracture. 3.  Osteoporosis.  Original Report Authenticated By: P. Loralie Champagne,  M.D.   Dg C-arm 1-60 Min  08/31/2011  CLINICAL DATA: L3 Kyphoplasty   C-ARM 1-60 MINUTES  Fluoroscopy was utilized by the requesting physician.  No radiographic  interpretation.      History-  See Admission H&P below  Admission HPI: This is an 77 year old female, retirement   home resident, with history of hypothyroidism, hypercholesterolemia,   history of left distal wrist and left patella fractures, fell earlier   this month and had an L3 along with an L1 compressive fractures, presents to   [facility] Emergency Room with increasing back pain.  Workup   there included a lumbosacral CAT scan which showed retropulsion of L3   with 50% spinal canal compromise.  She denies any lower extremity   weakness, bowel or bladder incontinence.  She does have increased pain   but denied any nausea, vomiting, fever, or chills.  Dr. Danielle Dess of   Neurosurgery was consulted by Dr. Earlene Plater and had recommended that she be   admitted for observation, to control pain, and that he will see her in   consultation tomorrow.  Other workup is also significant for hypokalemia with serum potassium of 2.8.  She has no other complaints.   Hospital Course by problem list: Patient Active Hospital Problem List:  Compression fracture (09/02/2011)   Assessment: Dr. Danielle Dess was consulted by the ED and saw the patient and recommended R/B and alternatives to Surgery-Pt underwent ultimate Acryllic Kyphoplasty 11.2.12 with no adverse events-see his consult and op notes for details.  The etiology of her # was likely 2/2 to severe osteoporosis.    Hypothyroid (09/02/2011)   Assessment: TSH on this hospitalization was actually slightly high, but because of her being in acute pain, this could have been a cause for derangement of the endocrine axis-would recommend TSH 4-6 weeks to ensure that patient is back at Euthyroid state   Osteoporosis with pathological fracture (09/02/2011)   Assessment: Started Salmon calcitonin spray  and Calcium tid-will need a Vitamin D level check in 4-6 weeks Most importantly, patient will need to completely desist from smoking, but she did not wish any specific smoking cessation aids   Tobacco abuse (09/02/2011)   Assessment: doesn't wish smoking cessation aids, but is willing to trial quitting on her own    Asymptomatic bacteriuria (09/02/2011)   Assessment: UA initially was done, but was NOT a I/o specimen-received 1 dose of Rocephin, and all abx d/c'd on day 1.--no white count or fever   Hyperlipidemia (09/02/2011)   Assessment: continue statin-outpt risk stratification  Likely [place]-Stage 3-4: pt did well with therapy but had some intermittent confusion and also was very sensitive to Opiates-woud recommed Opiates sparingly as an oupt      LOS: 4 days     Discharge Vitals & PE:  BP 112/70  Pulse 80  Temp 97.2 F (36.2 C)  Resp 18  Ht 5\' 2"  (1.575 m)  Wt 48.72 kg (107 lb 6.5 oz)  BMI 19.65 kg/m2  SpO2 100%  CTAb S1 S2 M RUSE 2/6 Abd soft nt/nd Ext soft Back-no point tenderness  Discharge Labs: No results found  for this or any previous visit (from the past 24 hour(s)).  Disposition and follow-up:   Ms.Emily Bautista was discharged from Hospital in Excellent condition.   Follow-up Appointments: Discharge Orders    Future Orders Please Complete By Expires   Diet - low sodium heart healthy      Increase activity slowly      Discharge instructions      Comments:   Follow with ur PCP in 1 week See below   Call MD for:  temperature >100.4      Call MD for:  persistant nausea and vomiting      Call MD for:  severe uncontrolled pain      Call MD for:  difficulty breathing, headache or visual disturbances      Call MD for:  hives         Discharge Medications: Current Discharge Medication List    START taking these medications   Details  acetaminophen (TYLENOL) 325 MG tablet Take 2 tablets (650 mg total) by mouth every 6 (six) hours as needed. Qty: 30  tablet, Refills: 0    calcitonin, salmon, (MIACALCIN/FORTICAL) 200 UNIT/ACT nasal spray Place 1 spray into the nose daily. Qty: 3.7 mL, Refills: 0    calcium carbonate (OS-CAL - DOSED IN MG OF ELEMENTAL CALCIUM) 1250 MG tablet Take 1 tablet (500 mg of elemental calcium total) by mouth 3 (three) times daily with meals. Qty: 90 tablet, Refills: 12    cholecalciferol 1000 UNITS tablet Take 1 tablet (1,000 Units total) by mouth daily. Qty: 30 tablet, Refills: 12    HYDROcodone-acetaminophen (VICODIN) 5-500 MG per tablet Take 1 tablet by mouth every 8 (eight) hours as needed for pain (for severe pain not controlled by tylenol-Do not take if sedated). Qty: 30 tablet, Refills: 0      CONTINUE these medications which have NOT CHANGED   Details  aspirin 325 MG tablet Take 325 mg by mouth daily.     ezetimibe-simvastatin (VYTORIN) 10-40 MG per tablet Take 1 tablet by mouth at bedtime.     levothyroxine (SYNTHROID, LEVOTHROID) 88 MCG tablet Take 88 mcg by mouth daily.       STOP taking these medications     calcium-vitamin D (OSCAL WITH D) 500-200 MG-UNIT per tablet      Multiple Vitamins-Minerals (MULTIVITAMINS THER. W/MINERALS) TABS            SignedPleas Koch 09/02/2011, 11:49 AM

## 2011-09-02 NOTE — Progress Notes (Signed)
Social Work  CSW spoke with Pt dtr who stated she spoke with CM, who coordinated home care needs.   Pt not needing rehab placement. Milus Banister MSW,LCSW w/e Coverage [phone]

## 2011-11-25 ENCOUNTER — Emergency Department (INDEPENDENT_AMBULATORY_CARE_PROVIDER_SITE_OTHER): Payer: Medicare Other

## 2011-11-25 ENCOUNTER — Inpatient Hospital Stay (HOSPITAL_BASED_OUTPATIENT_CLINIC_OR_DEPARTMENT_OTHER)
Admission: EM | Admit: 2011-11-25 | Discharge: 2011-11-30 | DRG: 562 | Disposition: A | Discharge status: Skilled Nursing Facility | Payer: Medicare Other | Source: Ambulatory Visit | Attending: Internal Medicine | Admitting: Internal Medicine

## 2011-11-25 ENCOUNTER — Other Ambulatory Visit: Payer: Self-pay

## 2011-11-25 ENCOUNTER — Encounter (HOSPITAL_BASED_OUTPATIENT_CLINIC_OR_DEPARTMENT_OTHER): Payer: Self-pay | Admitting: *Deleted

## 2011-11-25 DIAGNOSIS — W19XXXA Unspecified fall, initial encounter: Secondary | ICD-10-CM

## 2011-11-25 DIAGNOSIS — M810 Age-related osteoporosis without current pathological fracture: Secondary | ICD-10-CM | POA: Diagnosis present

## 2011-11-25 DIAGNOSIS — M25559 Pain in unspecified hip: Secondary | ICD-10-CM

## 2011-11-25 DIAGNOSIS — Z7982 Long term (current) use of aspirin: Secondary | ICD-10-CM

## 2011-11-25 DIAGNOSIS — S42253A Displaced fracture of greater tuberosity of unspecified humerus, initial encounter for closed fracture: Principal | ICD-10-CM | POA: Diagnosis present

## 2011-11-25 DIAGNOSIS — Z8619 Personal history of other infectious and parasitic diseases: Secondary | ICD-10-CM

## 2011-11-25 DIAGNOSIS — Z9010 Acquired absence of unspecified breast and nipple: Secondary | ICD-10-CM

## 2011-11-25 DIAGNOSIS — W010XXA Fall on same level from slipping, tripping and stumbling without subsequent striking against object, initial encounter: Secondary | ICD-10-CM | POA: Diagnosis present

## 2011-11-25 DIAGNOSIS — J322 Chronic ethmoidal sinusitis: Secondary | ICD-10-CM

## 2011-11-25 DIAGNOSIS — M76899 Other specified enthesopathies of unspecified lower limb, excluding foot: Secondary | ICD-10-CM

## 2011-11-25 DIAGNOSIS — S42309A Unspecified fracture of shaft of humerus, unspecified arm, initial encounter for closed fracture: Secondary | ICD-10-CM

## 2011-11-25 DIAGNOSIS — S42293A Other displaced fracture of upper end of unspecified humerus, initial encounter for closed fracture: Secondary | ICD-10-CM

## 2011-11-25 DIAGNOSIS — G9340 Encephalopathy, unspecified: Secondary | ICD-10-CM | POA: Diagnosis present

## 2011-11-25 DIAGNOSIS — F17200 Nicotine dependence, unspecified, uncomplicated: Secondary | ICD-10-CM | POA: Diagnosis present

## 2011-11-25 DIAGNOSIS — J323 Chronic sphenoidal sinusitis: Secondary | ICD-10-CM

## 2011-11-25 DIAGNOSIS — Z8673 Personal history of transient ischemic attack (TIA), and cerebral infarction without residual deficits: Secondary | ICD-10-CM

## 2011-11-25 DIAGNOSIS — E785 Hyperlipidemia, unspecified: Secondary | ICD-10-CM | POA: Diagnosis present

## 2011-11-25 DIAGNOSIS — R269 Unspecified abnormalities of gait and mobility: Secondary | ICD-10-CM | POA: Diagnosis present

## 2011-11-25 DIAGNOSIS — M25539 Pain in unspecified wrist: Secondary | ICD-10-CM

## 2011-11-25 DIAGNOSIS — R296 Repeated falls: Secondary | ICD-10-CM

## 2011-11-25 DIAGNOSIS — S42302A Unspecified fracture of shaft of humerus, left arm, initial encounter for closed fracture: Secondary | ICD-10-CM | POA: Diagnosis present

## 2011-11-25 DIAGNOSIS — Z853 Personal history of malignant neoplasm of breast: Secondary | ICD-10-CM

## 2011-11-25 DIAGNOSIS — E039 Hypothyroidism, unspecified: Secondary | ICD-10-CM | POA: Diagnosis present

## 2011-11-25 DIAGNOSIS — R5381 Other malaise: Secondary | ICD-10-CM | POA: Diagnosis present

## 2011-11-25 DIAGNOSIS — M8448XA Pathological fracture, other site, initial encounter for fracture: Secondary | ICD-10-CM

## 2011-11-25 DIAGNOSIS — G930 Cerebral cysts: Secondary | ICD-10-CM | POA: Diagnosis present

## 2011-11-25 DIAGNOSIS — G9389 Other specified disorders of brain: Secondary | ICD-10-CM

## 2011-11-25 DIAGNOSIS — M545 Low back pain: Secondary | ICD-10-CM

## 2011-11-25 DIAGNOSIS — R930 Abnormal findings on diagnostic imaging of skull and head, not elsewhere classified: Secondary | ICD-10-CM | POA: Diagnosis present

## 2011-11-25 HISTORY — DX: Hypothyroidism, unspecified: E03.9

## 2011-11-25 HISTORY — DX: Unspecified viral hepatitis B without hepatic coma: B19.10

## 2011-11-25 HISTORY — DX: Wedge compression fracture of unspecified lumbar vertebra, initial encounter for closed fracture: S32.000A

## 2011-11-25 HISTORY — DX: Transient cerebral ischemic attack, unspecified: G45.9

## 2011-11-25 LAB — CBC
HCT: 37.9 % (ref 36.0–46.0)
Hemoglobin: 12.7 g/dL (ref 12.0–15.0)
MCV: 91.3 fL (ref 78.0–100.0)
Platelets: 206 10*3/uL (ref 150–400)
RBC: 4.15 MIL/uL (ref 3.87–5.11)
WBC: 10.3 10*3/uL (ref 4.0–10.5)

## 2011-11-25 LAB — DIFFERENTIAL
Eosinophils Relative: 1 % (ref 0–5)
Lymphocytes Relative: 9 % — ABNORMAL LOW (ref 12–46)
Lymphs Abs: 1 10*3/uL (ref 0.7–4.0)
Monocytes Relative: 7 % (ref 3–12)

## 2011-11-25 LAB — BASIC METABOLIC PANEL
CO2: 26 mEq/L (ref 19–32)
Calcium: 9.4 mg/dL (ref 8.4–10.5)
Glucose, Bld: 97 mg/dL (ref 70–99)
Sodium: 140 mEq/L (ref 135–145)

## 2011-11-25 MED ORDER — FENTANYL CITRATE 0.05 MG/ML IJ SOLN
25.0000 ug | Freq: Once | INTRAMUSCULAR | Status: AC
  Administered 2011-11-25: 25 ug via INTRAVENOUS
  Filled 2011-11-25: qty 2

## 2011-11-25 MED ORDER — FENTANYL CITRATE 0.05 MG/ML IJ SOLN
25.0000 ug | INTRAMUSCULAR | Status: DC | PRN
  Administered 2011-11-25: 25 ug via INTRAVENOUS
  Filled 2011-11-25: qty 2

## 2011-11-25 MED ORDER — SODIUM CHLORIDE 0.9 % IV SOLN
Freq: Once | INTRAVENOUS | Status: AC
  Administered 2011-11-25: 18:00:00 via INTRAVENOUS

## 2011-11-25 MED ORDER — FENTANYL CITRATE 0.05 MG/ML IJ SOLN
25.0000 ug | Freq: Once | INTRAMUSCULAR | Status: AC
  Administered 2011-11-25: 25 ug via INTRAVENOUS

## 2011-11-25 MED ORDER — SODIUM CHLORIDE 0.9 % IV SOLN
INTRAVENOUS | Status: AC
  Administered 2011-11-25: 03:00:00 via INTRAVENOUS

## 2011-11-25 NOTE — ED Notes (Signed)
To ED from nursing home via EMS, EMS reports pt tripped and fell, c/o left shoulder pain, no LOC, denies neck/back pain, LUE has good CMS, no deformity noted, NAD

## 2011-11-25 NOTE — ED Notes (Signed)
Transferred at this time by [facility] to [id number]

## 2011-11-25 NOTE — ED Notes (Signed)
Webb MD at bedside. 

## 2011-11-25 NOTE — ED Notes (Signed)
Patient transported to X-ray 

## 2011-11-25 NOTE — ED Provider Notes (Signed)
History   This chart was scribed for Forbes Cellar, MD by Melba Coon. The patient was seen in room MH12/MH12 and the patient's care was started at 5:53PM.    CSN: [id number]  Arrival date & time 11/25/11  1544   First MD Initiated Contact with Patient 11/25/11 1720      Chief Complaint  Patient presents with  . Fall    (Consider location/radiation/quality/duration/timing/severity/associated sxs/prior treatment) HPI Emily Bautista is a 78 y.o. female who presents to the Emergency Department complaining of constant moderate to severe left shoulder pain with an onset today pertaining to a fall. Pt's story of the fall is inconsistent but she states she did slip at her retirement home; nobody witnessed the fall. She states that she did not hit her head and there was no LOC. No cough, CP, SOB, n/v/d, abd pain, or allergies. Denies weakness. Denies other complaints at this time. Baseline chronic lower back pain from old compression fractures. Has not attempted ambulation since the fall.   ED Notes, ED Provider Notes from 11/25/11 0000 to 11/25/11 16:01:26       Joss Stoney Bang, RN 11/25/2011 15:57      Pt reports she fell while walking back from the dining room at [employer]- states "I don't know why I fell"- c/o left shoulder pain, left hip pain and back pain- has hx of lumbar compression fractures         Haskel Khan, RN 11/25/2011 15:40      To ED from nursing home via EMS, EMS reports pt tripped and fell, c/o left shoulder pain, no LOC, denies neck/back pain, LUE has good CMS, no deformity noted, NAD      Past Medical History  Diagnosis Date  . Hyperlipemia   . Thyroid disease   . Breast cancer   . Compression fracture of lumbar vertebra   . Hepatitis B infection   . TIA (transient ischemic attack)   . Hypothyroidism     Past Surgical History  Procedure Date  . Mastectomy   . Wrist surgery     No family history on file.  History  Substance Use Topics    . Smoking status: Current Some Day Smoker -- 1.0 packs/day  . Smokeless tobacco: Never Used  . Alcohol Use: No    OB History    Grav Para Term Preterm Abortions TAB SAB Ect Mult Living                  Review of Systems 10 Systems reviewed and are negative for acute change except as noted in the HPI.  Allergies  Review of patient's allergies indicates no known allergies.  Home Medications   Current Outpatient Rx  Name Route Sig Dispense Refill  . ASPIRIN 325 MG PO TABS Oral Take 325 mg by mouth daily.     Marland Kitchen CALCITONIN (SALMON) 200 UNIT/ACT NA SOLN Nasal Place 1 spray into the nose daily. 3.7 mL 0    Use for 7 days  . CALCIUM CARBONATE-VITAMIN D 500-200 MG-UNIT PO TABS Oral Take 1 tablet by mouth 2 (two) times daily.    Marland Kitchen EZETIMIBE-SIMVASTATIN 10-40 MG PO TABS Oral Take 1 tablet by mouth at bedtime.     Marland Kitchen LEVOTHYROXINE SODIUM 88 MCG PO TABS Oral Take 88 mcg by mouth daily.       BP 128/70  Pulse 82  Temp(Src) 97.9 F (36.6 C) (Oral)  Resp 16  SpO2 100%  Physical Exam  Nursing note  and vitals reviewed. Constitutional: She is oriented to person, place, and time. She appears well-developed and well-nourished.  HENT:  Head: Normocephalic and atraumatic.  Mouth/Throat: Mucous membranes are dry.  Eyes: Conjunctivae and EOM are normal. Pupils are equal, round, and reactive to light. No scleral icterus.  Neck: Normal range of motion. Neck supple. No thyromegaly present.  Cardiovascular: Normal rate, regular rhythm, normal heart sounds and intact distal pulses.  Exam reveals no gallop and no friction rub.   No murmur heard.      Radial pulses intact; good femoral pulses; posterior fibia and tibia pulses intact  Pulmonary/Chest: Effort normal and breath sounds normal. No stridor. She has no wheezes. She has no rales. She exhibits no tenderness.  Abdominal: Soft. Bowel sounds are normal. She exhibits no distension. There is no tenderness. There is no rebound.  Musculoskeletal:  Normal range of motion. She exhibits tenderness (Guarding left shoulder; minimal tenderness to [facility] joint; tenderness in lateral aspect of left wrist; minimal tenderness to proximal humerus). She exhibits no edema.       No tenderness to palpation of lt clavicle; no tenderness to palp to distal humerus; no bony tendernes to elbow. ttp lateral aspect of wrist. No deformity. No snuff box ttp. Radial pulse intact.   Min pain Lt hip with int rotation LLE. Distal pulses intact. Cap refill < 3 sec. Gross sensation intact.  Lymphadenopathy:    She has no cervical adenopathy.  Neurological: She is alert and oriented to person, place, and time. Coordination normal.       Strength 5/5  Skin: Skin is warm. No rash noted. No erythema.  Psychiatric: She has a normal mood and affect. Her behavior is normal.    Date: 11/25/2011  Rate: 83  Rhythm: normal sinus rhythm  QRS Axis: normal  Intervals: normal  ST/T Wave abnormalities: normal  Conduction Disutrbances:none  Narrative Interpretation: possible lt atrial enlargement  Old EKG Reviewed: no signficant    ED Course  Procedures (including critical care time)  DIAGNOSTIC STUDIES: Oxygen Saturation is 100% on room air, normal by my interpretation.    COORDINATION OF CARE:  Results for orders placed during the hospital encounter of 11/25/11  CBC      Component Value Range   WBC 10.3  4.0 - 10.5 (K/uL)   RBC 4.15  3.87 - 5.11 (MIL/uL)   Hemoglobin 12.7  12.0 - 15.0 (g/dL)   HCT 04.5  40.9 - 81.1 (%)   MCV 91.3  78.0 - 100.0 (fL)   MCH 30.6  26.0 - 34.0 (pg)   MCHC 33.5  30.0 - 36.0 (g/dL)   RDW 91.4  78.2 - 95.6 (%)   Platelets 206  150 - 400 (K/uL)  DIFFERENTIAL      Component Value Range   Neutrophils Relative 83 (*) 43 - 77 (%)   Neutro Abs 8.6 (*) 1.7 - 7.7 (K/uL)   Lymphocytes Relative 9 (*) 12 - 46 (%)   Lymphs Abs 1.0  0.7 - 4.0 (K/uL)   Monocytes Relative 7  3 - 12 (%)   Monocytes Absolute 0.7  0.1 - 1.0 (K/uL)   Eosinophils  Relative 1  0 - 5 (%)   Eosinophils Absolute 0.1  0.0 - 0.7 (K/uL)   Basophils Relative 0  0 - 1 (%)   Basophils Absolute 0.0  0.0 - 0.1 (K/uL)  BASIC METABOLIC PANEL      Component Value Range   Sodium 140  135 - 145 (mEq/L)  Potassium 3.6  3.5 - 5.1 (mEq/L)   Chloride 104  96 - 112 (mEq/L)   CO2 26  19 - 32 (mEq/L)   Glucose, Bld 97  70 - 99 (mg/dL)   BUN 17  6 - 23 (mg/dL)   Creatinine, Ser 1.61  0.50 - 1.10 (mg/dL)   Calcium 9.4  8.4 - 09.6 (mg/dL)   GFR calc non Af Amer 80 (*) >90 (mL/min)   GFR calc Af Amer >90  >90 (mL/min)    Dg Lumbar Spine Complete  11/25/2011  *RADIOLOGY REPORT*  Clinical Data: Fall.  Low back pain.  Prior compression fractures.  LUMBAR SPINE - COMPLETE 4+ VIEW  Comparison: 08/29/2011  Findings: Levoconvex lumbar scoliosis noted along with a vascular calcifications and bony demineralization.  Prior L3 compression fracture noted with prior augmentation.  A prior L1 compression fracture appears similar to the prior exams. I do not observe an acute lumbar spine compression fracture, and no lumbar subluxation is noted.  IMPRESSION:  1.  Stable appearance of old L1 compression fracture. 2.  Prior L3 compression fracture noted with vertebral augmentation. 3.  No definite acute fracture is identified in the lumbar spine. 4.  Bony demineralization. 5.  Vascular calcifications.  Original Report Authenticated By: Dellia Cloud, M.D.   Dg Wrist Complete Left  11/25/2011  *RADIOLOGY REPORT*  Clinical Data: Left wrist pain post fall  LEFT WRIST - COMPLETE 3+ VIEW  Comparison: 06/07/2010  Findings: Interval placement of a volar plate and multiple screws at distal left radius. Healed distal left radial metaphyseal fracture. Osseous demineralization. Minimal radiocarpal degenerative changes. Degenerative changes also present at first [facility] joint with radial subluxation of first metacarpal and significant spurring.  Orthopedic hardware appears intact. On the lateral view, a  subtle distal ulnar metaphyseal fracture is questioned. This is not seen on the AP or oblique views. No radial fracture identified.  IMPRESSION: Post ORIF of distal left radius. Osseous demineralization with scattered degenerative changes of the carpus as above. Cannot exclude nondisplaced distal left ulnar metaphyseal fracture; recommend clinical correlation for pain / tenderness at this site.  Original Report Authenticated By: Lollie Marrow, M.D.   Dg Hip Complete Left  11/25/2011  *RADIOLOGY REPORT*  Clinical Data: Fall.  Left hip pain.  LEFT HIP - COMPLETE 2+ VIEW  Comparison: CT scan dated 01/20 of 02/11  Findings: Asymmetric femoral head spurring is present, left greater than right.  This has a similar configuration to the prior CT scan, and I do not observe an acute left hip fracture.  Vascular calcifications noted.  IMPRESSION:  1.  Considerable spurring of the left femoral head appears similar to the prior CT scan from 11/19/2009.  I do not observe a definite left hip fracture.  If there is a high clinical index of suspicion (for example if the patient is unable to bear weight), then CT the bony pelvis may be warranted.  Original Report Authenticated By: Dellia Cloud, M.D.   Ct Head Wo Contrast  11/25/2011  *RADIOLOGY REPORT*  Clinical Data: Unwitnessed fall.  CT HEAD WITHOUT CONTRAST  Technique:  Contiguous axial images were obtained from the base of the skull through the vertex without contrast.  Comparison: None.  Findings: The brain stem, cerebellum, cerebral peduncles, thalami, basal ganglia, basilar cisterns, and ventricular system appear unremarkable.  Periventricular and corona radiata white matter hypodensities are most compatible with chronic ischemic microvascular white matter disease.  Bilobed fluid density along the left frontal lobe measuring 2.6  x 3.0 cm on image 19 of series 2 is noted.  This could represent an arachnoid cyst, epidermoid cyst, or residua from prior  encephalomalacia/infarct.  This could be further assessed with dedicated MRI.  It does not appear acute.  On images 13-15 of series 2, the basilar artery and left posterior cerebral artery appears slightly dense.  No intracranial hemorrhage or acute CVA is observed.  Bony demineralization is present along the skull base.  There is mild chronic sphenoid and ethmoid sinusitis.  IMPRESSION:  1. Slightly dense basilar and left posterior cerebral artery. Although quite likely due to atherosclerosis, if the patient has posterior fossa symptoms, MR angiography may be warranted for further characterization to exclude intravascular thrombus. 2.  Fluid density lesion along the periphery left frontal lobe may represent an arachnoid cyst, and there might cyst, or focal encephalomalacia from a prior infarct.  This could be further worked up with dedicated MRI. 3.  Bony demineralization. 4.  Mild chronic sphenoid and ethmoid sinusitis.  Original Report Authenticated By: Dellia Cloud, M.D.   Dg Humerus Left  11/25/2011  *RADIOLOGY REPORT*  Clinical Data: Fall.  Left humeral pain.  LEFT HUMERUS - 2+ VIEW  Comparison: None.  Findings: There is a fracture the greater tuberosity of the left humerus, with a concomitant suspected fracture of the anatomic neck of the humerus.  Reduced mobility noted.  No glenohumeral dislocation is observed.  IMPRESSION:  1.  Greater tuberosity fracture of the humerus, with equivocal fracture of the anatomic neck.  Original Report Authenticated By: Dellia Cloud, M.D.     1. Fall   2. Wrist pain   3. Humerus fracture   4. Hip pain     MDM  Patient presents after fall of unk cause. Labs reviewed and largely unremarkable. UA pending. CT head abnl, no acute bleed-- to be f/u as inpatient. Humerus fracture, Lt hip pain-- d/w ortho Dr. Luiz Blare who will see as inpatient.  Questionable wrist fracture. Pain improved at this time. Placed in velcro immobilizer.  Patient continues to  have significant pain. Neurovasc intact. She is not able to return to her retirement community-- no nursing staff per family. Needs admit for pain control, further w/u and evaluation, PT/OT and likely nursing home placement. Family comfortable with the plan. Discussed admission with [facility]. Transfer to [facility].   I personally performed the services described in this documentation, which was scribed in my presence. The recorded information has been reviewed and considered.         Forbes Cellar, MD 11/25/11 2153

## 2011-11-25 NOTE — Consult Note (Signed)
Reason for Consult:left shoulder pain  Referring Physician: hospitalists  Emily Bautista is an 78 y.o. female.  HPI: 78 yo female with history of dementia who fell earlier today and c/o l. Sided pain.  Evaluated in the ER and found to have l. Proximal humerus and we are consulted as patient needs admission for pain control and observation.  Past Medical History  Diagnosis Date  . Hyperlipemia   . Thyroid disease   . Breast cancer   . Compression fracture of lumbar vertebra   . Hepatitis B infection   . TIA (transient ischemic attack)   . Hypothyroidism     Past Surgical History  Procedure Date  . Mastectomy   . Wrist surgery     No family history on file.  Social History:  reports that she has been smoking.  She has never used smokeless tobacco. She reports that she does not drink alcohol or use illicit drugs.  Allergies: No Known Allergies  Medications: I have reviewed the patient's current medications.  Results for orders placed during the hospital encounter of 11/25/11 (from the past 48 hour(s))  CBC     Status: Normal   Collection Time   11/25/11  6:30 PM      Component Value Range Comment   WBC 10.3  4.0 - 10.5 (K/uL)    RBC 4.15  3.87 - 5.11 (MIL/uL)    Hemoglobin 12.7  12.0 - 15.0 (g/dL)    HCT 19.1  47.8 - 29.5 (%)    MCV 91.3  78.0 - 100.0 (fL)    MCH 30.6  26.0 - 34.0 (pg)    MCHC 33.5  30.0 - 36.0 (g/dL)    RDW 62.1  30.8 - 65.7 (%)    Platelets 206  150 - 400 (K/uL)   DIFFERENTIAL     Status: Abnormal   Collection Time   11/25/11  6:30 PM      Component Value Range Comment   Neutrophils Relative 83 (*) 43 - 77 (%)    Neutro Abs 8.6 (*) 1.7 - 7.7 (K/uL)    Lymphocytes Relative 9 (*) 12 - 46 (%)    Lymphs Abs 1.0  0.7 - 4.0 (K/uL)    Monocytes Relative 7  3 - 12 (%)    Monocytes Absolute 0.7  0.1 - 1.0 (K/uL)    Eosinophils Relative 1  0 - 5 (%)    Eosinophils Absolute 0.1  0.0 - 0.7 (K/uL)    Basophils Relative 0  0 - 1 (%)    Basophils Absolute 0.0   0.0 - 0.1 (K/uL)   BASIC METABOLIC PANEL     Status: Abnormal   Collection Time   11/25/11  6:30 PM      Component Value Range Comment   Sodium 140  135 - 145 (mEq/L)    Potassium 3.6  3.5 - 5.1 (mEq/L)    Chloride 104  96 - 112 (mEq/L)    CO2 26  19 - 32 (mEq/L)    Glucose, Bld 97  70 - 99 (mg/dL)    BUN 17  6 - 23 (mg/dL)    Creatinine, Ser 8.46  0.50 - 1.10 (mg/dL)    Calcium 9.4  8.4 - 10.5 (mg/dL)    GFR calc non Af Amer 80 (*) >90 (mL/min)    GFR calc Af Amer >90  >90 (mL/min)     Dg Lumbar Spine Complete  11/25/2011  *RADIOLOGY REPORT*  Clinical Data: Fall.  Low back  pain.  Prior compression fractures.  LUMBAR SPINE - COMPLETE 4+ VIEW  Comparison: 08/29/2011  Findings: Levoconvex lumbar scoliosis noted along with a vascular calcifications and bony demineralization.  Prior L3 compression fracture noted with prior augmentation.  A prior L1 compression fracture appears similar to the prior exams. I do not observe an acute lumbar spine compression fracture, and no lumbar subluxation is noted.  IMPRESSION:  1.  Stable appearance of old L1 compression fracture. 2.  Prior L3 compression fracture noted with vertebral augmentation. 3.  No definite acute fracture is identified in the lumbar spine. 4.  Bony demineralization. 5.  Vascular calcifications.  Original Report Authenticated By: Dellia Cloud, M.D.   Dg Wrist Complete Left  11/25/2011  *RADIOLOGY REPORT*  Clinical Data: Left wrist pain post fall  LEFT WRIST - COMPLETE 3+ VIEW  Comparison: 06/07/2010  Findings: Interval placement of a volar plate and multiple screws at distal left radius. Healed distal left radial metaphyseal fracture. Osseous demineralization. Minimal radiocarpal degenerative changes. Degenerative changes also present at first [facility] joint with radial subluxation of first metacarpal and significant spurring.  Orthopedic hardware appears intact. On the lateral view, a subtle distal ulnar metaphyseal fracture is  questioned. This is not seen on the AP or oblique views. No radial fracture identified.  IMPRESSION: Post ORIF of distal left radius. Osseous demineralization with scattered degenerative changes of the carpus as above. Cannot exclude nondisplaced distal left ulnar metaphyseal fracture; recommend clinical correlation for pain / tenderness at this site.  Original Report Authenticated By: Lollie Marrow, M.D.   Dg Hip Complete Left  11/25/2011  *RADIOLOGY REPORT*  Clinical Data: Fall.  Left hip pain.  LEFT HIP - COMPLETE 2+ VIEW  Comparison: CT scan dated 01/20 of 02/11  Findings: Asymmetric femoral head spurring is present, left greater than right.  This has a similar configuration to the prior CT scan, and I do not observe an acute left hip fracture.  Vascular calcifications noted.  IMPRESSION:  1.  Considerable spurring of the left femoral head appears similar to the prior CT scan from 11/19/2009.  I do not observe a definite left hip fracture.  If there is a high clinical index of suspicion (for example if the patient is unable to bear weight), then CT the bony pelvis may be warranted.  Original Report Authenticated By: Dellia Cloud, M.D.   Ct Head Wo Contrast  11/25/2011  *RADIOLOGY REPORT*  Clinical Data: Unwitnessed fall.  CT HEAD WITHOUT CONTRAST  Technique:  Contiguous axial images were obtained from the base of the skull through the vertex without contrast.  Comparison: None.  Findings: The brain stem, cerebellum, cerebral peduncles, thalami, basal ganglia, basilar cisterns, and ventricular system appear unremarkable.  Periventricular and corona radiata white matter hypodensities are most compatible with chronic ischemic microvascular white matter disease.  Bilobed fluid density along the left frontal lobe measuring 2.6 x 3.0 cm on image 19 of series 2 is noted.  This could represent an arachnoid cyst, epidermoid cyst, or residua from prior encephalomalacia/infarct.  This could be further  assessed with dedicated MRI.  It does not appear acute.  On images 13-15 of series 2, the basilar artery and left posterior cerebral artery appears slightly dense.  No intracranial hemorrhage or acute CVA is observed.  Bony demineralization is present along the skull base.  There is mild chronic sphenoid and ethmoid sinusitis.  IMPRESSION:  1. Slightly dense basilar and left posterior cerebral artery. Although quite likely due to  atherosclerosis, if the patient has posterior fossa symptoms, MR angiography may be warranted for further characterization to exclude intravascular thrombus. 2.  Fluid density lesion along the periphery left frontal lobe may represent an arachnoid cyst, and there might cyst, or focal encephalomalacia from a prior infarct.  This could be further worked up with dedicated MRI. 3.  Bony demineralization. 4.  Mild chronic sphenoid and ethmoid sinusitis.  Original Report Authenticated By: Dellia Cloud, M.D.   Dg Humerus Left  11/25/2011  *RADIOLOGY REPORT*  Clinical Data: Fall.  Left humeral pain.  LEFT HUMERUS - 2+ VIEW  Comparison: None.  Findings: There is a fracture the greater tuberosity of the left humerus, with a concomitant suspected fracture of the anatomic neck of the humerus.  Reduced mobility noted.  No glenohumeral dislocation is observed.  IMPRESSION:  1.  Greater tuberosity fracture of the humerus, with equivocal fracture of the anatomic neck.  Original Report Authenticated By: Dellia Cloud, M.D.  My review of l. Hip film shows possible side to side difference which could represent subtle fx. ROS: ihave reviewed the patients review of systems and there are no posatives as it relates to the HPI.  PHYSICAL EXAM Blood pressure 128/70, pulse 82, temperature 97.9 F (36.6 C), temperature source Oral, resp. rate 16, SpO2 100.00%. Cachectic female in mild distress with pain from fall Eyes not dilated Not using accessory muscles of respiration No rashes on  exposed skin L. Upper ext: pain with ROM 2+ distal pulse. no pain w/ palp of wrist. L. Lower ext: some mild pain on ROM l. hip  Assessment/Plan: 78 yo somewhat demented female with fall onto left side with known prox humeral fx/   !. Sling l arm all times will begin some gentle PT over this coming week   2 monitor l. Wrist and use velcro wrist splint as needed   3.Keep close eye on l. Hip and if patient not able to ambulate very soon-- she will need MRI l. Hip to r/o fx. Of l. hipmm  Nasiah Polinsky L 11/25/2011, 8:06 PM

## 2011-11-25 NOTE — ED Notes (Signed)
Pt reports she fell while walking back from the dining room at [employer]- states "I don't know why I fell"- c/o left shoulder pain, left hip pain and back pain- has hx of lumbar compression fractures

## 2011-11-26 ENCOUNTER — Encounter (HOSPITAL_COMMUNITY): Payer: Self-pay | Admitting: General Practice

## 2011-11-26 ENCOUNTER — Inpatient Hospital Stay (HOSPITAL_COMMUNITY): Payer: Medicare Other

## 2011-11-26 DIAGNOSIS — S42302A Unspecified fracture of shaft of humerus, left arm, initial encounter for closed fracture: Secondary | ICD-10-CM | POA: Diagnosis present

## 2011-11-26 DIAGNOSIS — R930 Abnormal findings on diagnostic imaging of skull and head, not elsewhere classified: Secondary | ICD-10-CM | POA: Diagnosis present

## 2011-11-26 DIAGNOSIS — M810 Age-related osteoporosis without current pathological fracture: Secondary | ICD-10-CM | POA: Diagnosis present

## 2011-11-26 DIAGNOSIS — R296 Repeated falls: Secondary | ICD-10-CM

## 2011-11-26 LAB — BASIC METABOLIC PANEL
Calcium: 8.9 mg/dL (ref 8.4–10.5)
Chloride: 107 mEq/L (ref 96–112)
Creatinine, Ser: 0.48 mg/dL — ABNORMAL LOW (ref 0.50–1.10)
GFR calc Af Amer: >90 mL/min (ref >90)
GFR calc non Af Amer: 86 mL/min — ABNORMAL LOW (ref >90)

## 2011-11-26 LAB — CBC
MCHC: 32.6 g/dL (ref 30.0–36.0)
Platelets: 195 10*3/uL (ref 150–400)
RDW: 13.4 % (ref 11.5–15.5)
WBC: 8.6 10*3/uL (ref 4.0–10.5)

## 2011-11-26 LAB — URINALYSIS, ROUTINE W REFLEX MICROSCOPIC
Nitrite: NEGATIVE
Protein, ur: NEGATIVE mg/dL
Specific Gravity, Urine: 1.024 (ref 1.005–1.030)
Urobilinogen, UA: 1 mg/dL (ref 0.0–1.0)

## 2011-11-26 LAB — MRSA PCR SCREENING: MRSA by PCR: NEGATIVE

## 2011-11-26 MED ORDER — ACETAMINOPHEN 325 MG PO TABS
650.0000 mg | ORAL_TABLET | Freq: Four times a day (QID) | ORAL | Status: DC | PRN
  Administered 2011-11-29: 650 mg via ORAL
  Filled 2011-11-26: qty 2

## 2011-11-26 MED ORDER — HYDROMORPHONE HCL PF 1 MG/ML IJ SOLN
0.5000 mg | INTRAMUSCULAR | Status: DC | PRN
  Administered 2011-11-27 – 2011-11-29 (×3): 1 mg via INTRAVENOUS
  Filled 2011-11-26 (×3): qty 1

## 2011-11-26 MED ORDER — OXYCODONE HCL 5 MG PO TABS
5.0000 mg | ORAL_TABLET | ORAL | Status: DC | PRN
  Administered 2011-11-26 – 2011-11-30 (×4): 5 mg via ORAL
  Filled 2011-11-26 (×4): qty 1

## 2011-11-26 MED ORDER — VITAMIN D3 25 MCG (1000 UNIT) PO TABS
1000.0000 [IU] | ORAL_TABLET | Freq: Every day | ORAL | Status: DC
  Administered 2011-11-26 – 2011-11-30 (×5): 1000 [IU] via ORAL
  Filled 2011-11-26 (×5): qty 1

## 2011-11-26 MED ORDER — ONDANSETRON HCL 4 MG PO TABS: 4.0000 mg | ORAL_TABLET | Freq: Four times a day (QID) | ORAL | Status: DC | PRN

## 2011-11-26 MED ORDER — CALCIUM CARBONATE-VITAMIN D 500-200 MG-UNIT PO TABS
1.0000 | ORAL_TABLET | Freq: Two times a day (BID) | ORAL | Status: DC
  Administered 2011-11-26 – 2011-11-30 (×9): 1 via ORAL
  Filled 2011-11-26 (×10): qty 1

## 2011-11-26 MED ORDER — SODIUM CHLORIDE 0.9 % IV SOLN
INTRAVENOUS | Status: DC
  Administered 2011-11-26 – 2011-11-28 (×4): via INTRAVENOUS
  Administered 2011-11-28: 20 mL/h via INTRAVENOUS

## 2011-11-26 MED ORDER — EZETIMIBE-SIMVASTATIN 10-40 MG PO TABS
1.0000 | ORAL_TABLET | Freq: Every day | ORAL | Status: DC
  Administered 2011-11-26 – 2011-11-29 (×4): 1 via ORAL
  Filled 2011-11-26 (×5): qty 1

## 2011-11-26 MED ORDER — NICOTINE 7 MG/24HR TD PT24
7.0000 mg | MEDICATED_PATCH | Freq: Every day | TRANSDERMAL | Status: DC
  Administered 2011-11-27 – 2011-11-30 (×5): 7 mg via TRANSDERMAL
  Filled 2011-11-26 (×5): qty 1

## 2011-11-26 MED ORDER — ACETAMINOPHEN 650 MG RE SUPP: 650.0000 mg | Freq: Four times a day (QID) | RECTAL | Status: DC | PRN

## 2011-11-26 MED ORDER — ONDANSETRON HCL 4 MG/2ML IJ SOLN: 4.0000 mg | Freq: Four times a day (QID) | INTRAMUSCULAR | Status: DC | PRN

## 2011-11-26 MED ORDER — ZOLPIDEM TARTRATE 5 MG PO TABS: 5.0000 mg | ORAL_TABLET | Freq: Every evening | ORAL | Status: DC | PRN

## 2011-11-26 MED ORDER — ALUM & MAG HYDROXIDE-SIMETH 200-200-20 MG/5ML PO SUSP
30.0000 mL | Freq: Four times a day (QID) | ORAL | Status: DC | PRN
Start: 2011-11-26 — End: 2011-11-30

## 2011-11-26 MED ORDER — CALCITONIN (SALMON) 200 UNIT/ACT NA SOLN
1.0000 | Freq: Every day | NASAL | Status: DC
  Administered 2011-11-27 – 2011-11-30 (×3): 1 via NASAL
  Filled 2011-11-26 (×2): qty 3.7

## 2011-11-26 MED ORDER — GADOBENATE DIMEGLUMINE 529 MG/ML IV SOLN
10.0000 mL | Freq: Once | INTRAVENOUS | Status: AC
  Administered 2011-11-26: 10 mL via INTRAVENOUS

## 2011-11-26 MED ORDER — ASPIRIN 325 MG PO TABS
325.0000 mg | ORAL_TABLET | Freq: Every day | ORAL | Status: DC
  Administered 2011-11-26 – 2011-11-30 (×5): 325 mg via ORAL
  Filled 2011-11-26 (×5): qty 1

## 2011-11-26 MED ORDER — LEVOTHYROXINE SODIUM 88 MCG PO TABS
88.0000 ug | ORAL_TABLET | Freq: Every day | ORAL | Status: DC
  Administered 2011-11-26 – 2011-11-30 (×5): 88 ug via ORAL
  Filled 2011-11-26 (×6): qty 1

## 2011-11-26 NOTE — Progress Notes (Signed)
Clinical Social Worker completed psychosocial assessment and placed in shadow chart. Pt is a resident at [facility] and pt daughter feels pt needs higher level of care at discharge. Pt daughter reports that PT is planning on evaluating pt and pt daughter agreeable to initiation of SNF search. Pt daughter would like pt information sent to [facility] first and then if they are unable to offer expand search in [place]. Clinical Social Worker to complete FL-2 and initiate SNF search to [facility] and follow up with pt daughter in regard to bed offer. Clinical Social Worker to facilitate pt discharge needs when pt medically ready for discharge.  Jacklynn Lewis, MSW, LCSWA  Clinical Social Work [phone]

## 2011-11-26 NOTE — Progress Notes (Signed)
DAILY PROGRESS NOTE                              GENERAL INTERNAL MEDICINE [facility]  SUBJECTIVE Daughter at bedside, pain is controlled.  OBJECTIVE: BP 111/55  Pulse 90  Temp(Src) 97.5 F (36.4 C) (Oral)  Resp 18  Ht 5\' 2"  (1.575 m)  Wt 46.8 kg (103 lb 2.8 oz)  BMI 18.87 kg/m2  SpO2 97%  Intake/Output Summary (Last 24 hours) at 11/26/11 1325 Last data filed at 11/26/11 4098  Gross per 24 hour  Intake 491.25 ml  Output    200 ml  Net 291.25 ml                      Weight change:  Physical Exam: General: Alert and awake oriented x3 not in any acute distress. HEENT: anicteric sclera, pupils equal reactive to light and accommodation CVS: S1-S2 heard, no murmur rubs or gallops Chest: clear to auscultation bilaterally, no wheezing rales or rhonchi Abdomen:  normal bowel sounds, soft, nontender, nondistended, no organomegaly Neuro: Cranial nerves II-XII intact, no focal neurological deficits Extremities: no cyanosis, no clubbing or edema noted bilaterally   Lab Results:  [facility] 11/26/11 0645 11/25/11 1830  NA 141 140  K 3.2* 3.6  CL 107 104  CO2 25 26  GLUCOSE 97 97  BUN 13 17  CREATININE 0.48* 0.60  CALCIUM 8.9 9.4  MG -- --  PHOS -- --   Basename 11/26/11 0645 11/25/11 1830  WBC 8.6 10.3  NEUTROABS -- 8.6*  HGB 10.6* 12.7  HCT 32.5* 37.9  MCV 90.8 91.3  PLT 195 206   Micro Results: Recent Results (from the past 240 hour(s))  MRSA PCR SCREENING     Status: Normal   Collection Time   11/25/11 11:00 PM      Component Value Range Status Comment   MRSA by PCR NEGATIVE  NEGATIVE  Final     Studies/Results: Dg Lumbar Spine Complete  11/25/2011  *RADIOLOGY REPORT*  Clinical Data: Fall.  Low back pain.  Prior compression fractures.  LUMBAR SPINE - COMPLETE 4+ VIEW  Comparison: 08/29/2011  Findings: Levoconvex lumbar scoliosis noted along with a vascular calcifications and bony demineralization.  Prior L3 compression fracture noted with prior  augmentation.  A prior L1 compression fracture appears similar to the prior exams. I do not observe an acute lumbar spine compression fracture, and no lumbar subluxation is noted.  IMPRESSION:  1.  Stable appearance of old L1 compression fracture. 2.  Prior L3 compression fracture noted with vertebral augmentation. 3.  No definite acute fracture is identified in the lumbar spine. 4.  Bony demineralization. 5.  Vascular calcifications.  Original Report Authenticated By: Dellia Cloud, M.D.   Dg Wrist Complete Left  11/25/2011  *RADIOLOGY REPORT*  Clinical Data: Left wrist pain post fall  LEFT WRIST - COMPLETE 3+ VIEW  Comparison: 06/07/2010  Findings: Interval placement of a volar plate and multiple screws at distal left radius. Healed distal left radial metaphyseal fracture. Osseous demineralization. Minimal radiocarpal degenerative changes. Degenerative changes also present at first [facility] joint with radial subluxation of first metacarpal and significant spurring.  Orthopedic hardware appears intact. On the lateral view, a subtle distal ulnar metaphyseal fracture is questioned. This is not seen on the AP or oblique views. No radial fracture identified.  IMPRESSION: Post ORIF of distal left radius. Osseous demineralization with scattered degenerative changes of  the carpus as above. Cannot exclude nondisplaced distal left ulnar metaphyseal fracture; recommend clinical correlation for pain / tenderness at this site.  Original Report Authenticated By: Lollie Marrow, M.D.   Dg Hip Complete Left  11/25/2011  *RADIOLOGY REPORT*  Clinical Data: Fall.  Left hip pain.  LEFT HIP - COMPLETE 2+ VIEW  Comparison: CT scan dated 01/20 of 02/11  Findings: Asymmetric femoral head spurring is present, left greater than right.  This has a similar configuration to the prior CT scan, and I do not observe an acute left hip fracture.  Vascular calcifications noted.  IMPRESSION:  1.  Considerable spurring of the left femoral head  appears similar to the prior CT scan from 11/19/2009.  I do not observe a definite left hip fracture.  If there is a high clinical index of suspicion (for example if the patient is unable to bear weight), then CT the bony pelvis may be warranted.  Original Report Authenticated By: Dellia Cloud, M.D.   Ct Head Wo Contrast  11/25/2011  *RADIOLOGY REPORT*  Clinical Data: Unwitnessed fall.  CT HEAD WITHOUT CONTRAST  Technique:  Contiguous axial images were obtained from the base of the skull through the vertex without contrast.  Comparison: None.  Findings: The brain stem, cerebellum, cerebral peduncles, thalami, basal ganglia, basilar cisterns, and ventricular system appear unremarkable.  Periventricular and corona radiata white matter hypodensities are most compatible with chronic ischemic microvascular white matter disease.  Bilobed fluid density along the left frontal lobe measuring 2.6 x 3.0 cm on image 19 of series 2 is noted.  This could represent an arachnoid cyst, epidermoid cyst, or residua from prior encephalomalacia/infarct.  This could be further assessed with dedicated MRI.  It does not appear acute.  On images 13-15 of series 2, the basilar artery and left posterior cerebral artery appears slightly dense.  No intracranial hemorrhage or acute CVA is observed.  Bony demineralization is present along the skull base.  There is mild chronic sphenoid and ethmoid sinusitis.  IMPRESSION:  1. Slightly dense basilar and left posterior cerebral artery. Although quite likely due to atherosclerosis, if the patient has posterior fossa symptoms, MR angiography may be warranted for further characterization to exclude intravascular thrombus. 2.  Fluid density lesion along the periphery left frontal lobe may represent an arachnoid cyst, and there might cyst, or focal encephalomalacia from a prior infarct.  This could be further worked up with dedicated MRI. 3.  Bony demineralization. 4.  Mild chronic sphenoid  and ethmoid sinusitis.  Original Report Authenticated By: Dellia Cloud, M.D.   Mr Laqueta Jean Wo Contrast  11/26/2011  *RADIOLOGY REPORT*  Clinical Data: Abnormality left frontal lobe on recent CT. Unwitnessed fall.  MRI HEAD WITHOUT AND WITH CONTRAST  Technique:  Multiplanar, multiecho pulse sequences of the brain and surrounding structures were obtained according to standard protocol without and with intravenous contrast  Contrast: 10mL MULTIHANCE GADOBENATE DIMEGLUMINE 529 MG/ML IV SOLN  Comparison: 11/25/2011 CT.  No comparison MR.  Findings: Motion degraded exam.  Anterior left frontal lobe extra-axial appearing 3.4 x 2.3 x 2.9 cm cystic lesion with a septation and minimal surrounding T2 altered signal intensity.  No enhancement.  No restricted motion.  This may represent a minimally complex arachnoid cyst.  No erosion of the calvarium as can be seen with longstanding arachnoid cyst. Stability can be confirmed on follow-up.  No acute infarct.  No intracranial hemorrhage.  Global atrophy.  Ventricular prominence probably related to atrophy rather  than hydrocephalus.  Moderate small vessel disease type changes.  Cervical spondylotic changes with spinal stenosis and cord flattening C4-5.  Degenerative changes facet joints upper cervical spine greater on the right.  Atherosclerotic type changes of the vertebral arteries and internal carotid arteries.  Major intracranial vascular structures are patent.  Minimal to mild paranasal sinus mucosal thickening.  Partially empty sella incidentally noted.  IMPRESSION: Motion degraded exam.  Anterior left frontal lobe extra-axial appearing 3.4 x 2.3 x 2.9 cm cystic lesion with a septation and minimal surrounding T2 altered signal intensity.  No enhancement.  No restricted motion suggestive of a minimally complex arachnoid cyst.  No acute infarct.  No intracranial hemorrhage.  Global atrophy.  Moderate small vessel disease type changes.  Cervical spondylotic changes  with spinal stenosis and cord flattening C4-5.  Degenerative changes facet joints upper cervical spine greater on the right.  Atherosclerotic type changes of the vertebral arteries and internal carotid arteries.  Major intracranial vascular structures are patent.  Minimal to mild paranasal sinus mucosal thickening.  Original Report Authenticated By: Fuller Canada, M.D.   Dg Humerus Left  11/25/2011  *RADIOLOGY REPORT*  Clinical Data: Fall.  Left humeral pain.  LEFT HUMERUS - 2+ VIEW  Comparison: None.  Findings: There is a fracture the greater tuberosity of the left humerus, with a concomitant suspected fracture of the anatomic neck of the humerus.  Reduced mobility noted.  No glenohumeral dislocation is observed.  IMPRESSION:  1.  Greater tuberosity fracture of the humerus, with equivocal fracture of the anatomic neck.  Original Report Authenticated By: Dellia Cloud, M.D.   Medications: Scheduled Meds:   . sodium chloride   Intravenous Once  . sodium chloride   Intravenous STAT  . aspirin  325 mg Oral Daily  . calcitonin (salmon)  1 spray Alternating Nares Daily  . calcium-vitamin D  1 tablet Oral BID  . cholecalciferol  1,000 Units Oral Daily  . ezetimibe-simvastatin  1 tablet Oral QHS  . fentaNYL  25 mcg Intravenous Once  . fentaNYL  25 mcg Intravenous Once  . fentaNYL  25 mcg Intravenous Once  . gadobenate dimeglumine  10 mL Intravenous Once  . levothyroxine  88 mcg Oral QAC breakfast   Continuous Infusions:   . sodium chloride 75 mL/hr at 11/26/11 0015   PRN Meds:.acetaminophen, acetaminophen, alum & mag hydroxide-simeth, fentaNYL, HYDROmorphone, ondansetron (ZOFRAN) IV, ondansetron, oxyCODONE, zolpidem  ASSESSMENT & PLAN: Active Problems:  Osteoporosis  Closed left humeral fracture  Falls frequently  Abnormal CT scan of head  Left humeral fracture Patient evaluated by Dr. Luiz Blare from orthopedic surgery. He recommended physical therapy and non-surgical intervention  by a splint, and monitor the left wrist Velcro splint as needed.  Frequent falls Patient had frequent falls which appears to be on mechanical from generalized weakness and chronic debility. Patient came in to the hospital more than once with fractures after mechanical fall the patient did not lose her consciousness or did not have any type of prodrome before the fall. Patient is on telemetry acute coronary syndrome be ruled out. Since MRI of the head ruled out acute stokes.  Abnormal CT scan of the head CT scan of the head showed fluid-filled density likely subarachnoid cyst. MRI of the brain was done and showed minimally complex arachnoid cyst measures 3.4 x 2.3 x 2.9 centimeter. Because of patient's age and other comorbidities, this was discussed with her and her and no intervention or further workup intended.  Osteoporosis Continue patient's  calcium and vitamin D supplementation.   LOS: 1 day   Samie Barclift A 11/26/2011, 1:25 PM

## 2011-11-26 NOTE — Progress Notes (Signed)
Utilization review completed. Cindia Hustead Watson 11/26/2011 

## 2011-11-26 NOTE — H&P (Addendum)
DATE OF ADMISSION:  11/26/2011  PCP:   Junious Silk, MD, MD   Chief Complaint: Fall Left Shoulder Pain   HPI: Emily Bautista is an 78 y.o. female who was taken to the [facility] ED after suffering a fall with subsequent left shoulder pain.  She was leaving the dining room at her [facility] home and reportedly tripped and fell onto her left side.   In the ED an X-Ray was performed and a fracture of the anatomic neck and greater tuberosity of the Left humerus was found.   Orthopedics was contacted and Dr. Luiz Blare the consultant on call discussed the patient with the EDP.  History obtained through the medical records and ED report, on interview patient is confused most likely due to pain medication that was given.    Past Medical History  Diagnosis Date  . Hyperlipemia   . Thyroid disease   . Breast cancer   . Compression fracture of lumbar vertebra   . Hepatitis B infection   . TIA (transient ischemic attack)   . Hypothyroidism     Past Surgical History  Procedure Date  . Mastectomy   . Wrist surgery     Medications:  HOME MEDS: Prior to Admission medications   Medication Sig Start Date End Date Taking? Authorizing Provider  aspirin 325 MG tablet Take 325 mg by mouth daily.    Yes Historical Provider, MD  calcitonin, salmon, (MIACALCIN/FORTICAL) 200 UNIT/ACT nasal spray Place 1 spray into the nose daily. 09/02/11 09/01/12 Yes Pleas Koch, MD  calcium-vitamin D (OSCAL WITH D) 500-200 MG-UNIT per tablet Take 1 tablet by mouth 2 (two) times daily.   Yes Historical Provider, MD  cholecalciferol (VITAMIN D) 1000 UNITS tablet Take 1,000 Units by mouth daily.   Yes Historical Provider, MD  ezetimibe-simvastatin (VYTORIN) 10-40 MG per tablet Take 1 tablet by mouth at bedtime.    Yes Historical Provider, MD  levothyroxine (SYNTHROID, LEVOTHROID) 88 MCG tablet Take 88 mcg by mouth daily.    Yes Historical Provider, MD    Allergies:  No Known Allergies  Social History:   reports that she has been smoking.  She has never used smokeless tobacco. She reports that she does not drink alcohol or use illicit drugs.  Family History: No family history on file.  Review of Systems: Unable to obtain from the patient.     Physical Exam:  GEN:  Pleasant 78 year old elderly thin Caucasian female examined  and in no acute distress; cooperative with exam Filed Vitals:   11/25/11 1544 11/25/11 2147 11/25/11 2303  BP: 128/70 115/50 120/59  Pulse: 82 90 86  Temp: 97.9 F (36.6 C)  98.2 F (36.8 C)  TempSrc: Oral  Oral  Resp: 16 21 18   Height:   5\' 2"  (1.575 m)  Weight:   46.8 kg (103 lb 2.8 oz)  SpO2: 100% 95% 95%   Blood pressure 120/59, pulse 86, temperature 98.2 F (36.8 C), temperature source Oral, resp. rate 18, height 5\' 2"  (1.575 m), weight 46.8 kg (103 lb 2.8 oz), SpO2 95.00%. PSYCH: Sh is alert and oriented x4; does not appear anxious does not appear depressed; affect is normal HEENT: Normocephalic and Atraumatic, Mucous membranes pink; PERRLA; EOM intact; Fundi:  Benign;  No scleral icterus, Nares: Patent, Oropharynx: Clear, Neck:  FROM, no cervical lymphadenopathy nor thyromegaly or carotid bruit; no JVD; Breasts:: Not examined CHEST WALL: No tenderness CHEST: Normal respiration, clear to auscultation bilaterally HEART: Regular rate and rhythm;  no murmurs rubs or gallops BACK: No kyphosis or scoliosis; no CVA tenderness ABDOMEN: Positive Bowel Sounds, soft non-tender; no masses, no organomegaly Rectal Exam: Not done EXTREMITIES: No bone or joint deformity; age-appropriate arthropathy of the hands and knees; no cyanosis, clubbing or edema; no ulcerations. Genitalia: not examined PULSES: 2+ and symmetric SKIN: Normal hydration no rash or ulceration CNS: Cranial nerves 2-12 grossly intact no focal neurologic deficit   Labs & Imaging Results for orders placed during the hospital encounter of 11/25/11 (from the past 48 hour(s))  CBC     Status: Normal    Collection Time   11/25/11  6:30 PM      Component Value Range Comment   WBC 10.3  4.0 - 10.5 (K/uL)    RBC 4.15  3.87 - 5.11 (MIL/uL)    Hemoglobin 12.7  12.0 - 15.0 (g/dL)    HCT 14.7  82.9 - 56.2 (%)    MCV 91.3  78.0 - 100.0 (fL)    MCH 30.6  26.0 - 34.0 (pg)    MCHC 33.5  30.0 - 36.0 (g/dL)    RDW 13.0  86.5 - 78.4 (%)    Platelets 206  150 - 400 (K/uL)   DIFFERENTIAL     Status: Abnormal   Collection Time   11/25/11  6:30 PM      Component Value Range Comment   Neutrophils Relative 83 (*) 43 - 77 (%)    Neutro Abs 8.6 (*) 1.7 - 7.7 (K/uL)    Lymphocytes Relative 9 (*) 12 - 46 (%)    Lymphs Abs 1.0  0.7 - 4.0 (K/uL)    Monocytes Relative 7  3 - 12 (%)    Monocytes Absolute 0.7  0.1 - 1.0 (K/uL)    Eosinophils Relative 1  0 - 5 (%)    Eosinophils Absolute 0.1  0.0 - 0.7 (K/uL)    Basophils Relative 0  0 - 1 (%)    Basophils Absolute 0.0  0.0 - 0.1 (K/uL)   BASIC METABOLIC PANEL     Status: Abnormal   Collection Time   11/25/11  6:30 PM      Component Value Range Comment   Sodium 140  135 - 145 (mEq/L)    Potassium 3.6  3.5 - 5.1 (mEq/L)    Chloride 104  96 - 112 (mEq/L)    CO2 26  19 - 32 (mEq/L)    Glucose, Bld 97  70 - 99 (mg/dL)    BUN 17  6 - 23 (mg/dL)    Creatinine, Ser 6.96  0.50 - 1.10 (mg/dL)    Calcium 9.4  8.4 - 10.5 (mg/dL)    GFR calc non Af Amer 80 (*) >90 (mL/min)    GFR calc Af Amer >90  >90 (mL/min)   MRSA PCR SCREENING     Status: Normal   Collection Time   11/25/11 11:00 PM      Component Value Range Comment   MRSA by PCR NEGATIVE  NEGATIVE     Dg Lumbar Spine Complete  11/25/2011  *RADIOLOGY REPORT*  Clinical Data: Fall.  Low back pain.  Prior compression fractures.  LUMBAR SPINE - COMPLETE 4+ VIEW  Comparison: 08/29/2011  Findings: Levoconvex lumbar scoliosis noted along with a vascular calcifications and bony demineralization.  Prior L3 compression fracture noted with prior augmentation.  A prior L1 compression fracture appears similar to the prior  exams. I do not observe an acute lumbar spine compression fracture, and no lumbar  subluxation is noted.  IMPRESSION:  1.  Stable appearance of old L1 compression fracture. 2.  Prior L3 compression fracture noted with vertebral augmentation. 3.  No definite acute fracture is identified in the lumbar spine. 4.  Bony demineralization. 5.  Vascular calcifications.  Original Report Authenticated By: Dellia Cloud, M.D.   Dg Wrist Complete Left  11/25/2011  *RADIOLOGY REPORT*  Clinical Data: Left wrist pain post fall  LEFT WRIST - COMPLETE 3+ VIEW  Comparison: 06/07/2010  Findings: Interval placement of a volar plate and multiple screws at distal left radius. Healed distal left radial metaphyseal fracture. Osseous demineralization. Minimal radiocarpal degenerative changes. Degenerative changes also present at first [facility] joint with radial subluxation of first metacarpal and significant spurring.  Orthopedic hardware appears intact. On the lateral view, a subtle distal ulnar metaphyseal fracture is questioned. This is not seen on the AP or oblique views. No radial fracture identified.  IMPRESSION: Post ORIF of distal left radius. Osseous demineralization with scattered degenerative changes of the carpus as above. Cannot exclude nondisplaced distal left ulnar metaphyseal fracture; recommend clinical correlation for pain / tenderness at this site.  Original Report Authenticated By: Lollie Marrow, M.D.   Dg Hip Complete Left  11/25/2011  *RADIOLOGY REPORT*  Clinical Data: Fall.  Left hip pain.  LEFT HIP - COMPLETE 2+ VIEW  Comparison: CT scan dated 01/20 of 02/11  Findings: Asymmetric femoral head spurring is present, left greater than right.  This has a similar configuration to the prior CT scan, and I do not observe an acute left hip fracture.  Vascular calcifications noted.  IMPRESSION:  1.  Considerable spurring of the left femoral head appears similar to the prior CT scan from 11/19/2009.  I do not observe a  definite left hip fracture.  If there is a high clinical index of suspicion (for example if the patient is unable to bear weight), then CT the bony pelvis may be warranted.  Original Report Authenticated By: Dellia Cloud, M.D.   Ct Head Wo Contrast  11/25/2011  *RADIOLOGY REPORT*  Clinical Data: Unwitnessed fall.  CT HEAD WITHOUT CONTRAST  Technique:  Contiguous axial images were obtained from the base of the skull through the vertex without contrast.  Comparison: None.  Findings: The brain stem, cerebellum, cerebral peduncles, thalami, basal ganglia, basilar cisterns, and ventricular system appear unremarkable.  Periventricular and corona radiata white matter hypodensities are most compatible with chronic ischemic microvascular white matter disease.  Bilobed fluid density along the left frontal lobe measuring 2.6 x 3.0 cm on image 19 of series 2 is noted.  This could represent an arachnoid cyst, epidermoid cyst, or residua from prior encephalomalacia/infarct.  This could be further assessed with dedicated MRI.  It does not appear acute.  On images 13-15 of series 2, the basilar artery and left posterior cerebral artery appears slightly dense.  No intracranial hemorrhage or acute CVA is observed.  Bony demineralization is present along the skull base.  There is mild chronic sphenoid and ethmoid sinusitis.  IMPRESSION:  1. Slightly dense basilar and left posterior cerebral artery. Although quite likely due to atherosclerosis, if the patient has posterior fossa symptoms, MR angiography may be warranted for further characterization to exclude intravascular thrombus. 2.  Fluid density lesion along the periphery left frontal lobe may represent an arachnoid cyst, and there might cyst, or focal encephalomalacia from a prior infarct.  This could be further worked up with dedicated MRI. 3.  Bony demineralization. 4.  Mild  chronic sphenoid and ethmoid sinusitis.  Original Report Authenticated By: Dellia Cloud, M.D.   Dg Humerus Left  11/25/2011  *RADIOLOGY REPORT*  Clinical Data: Fall.  Left humeral pain.  LEFT HUMERUS - 2+ VIEW  Comparison: None.  Findings: There is a fracture the greater tuberosity of the left humerus, with a concomitant suspected fracture of the anatomic neck of the humerus.  Reduced mobility noted.  No glenohumeral dislocation is observed.  IMPRESSION:  1.  Greater tuberosity fracture of the humerus, with equivocal fracture of the anatomic neck.  Original Report Authenticated By: Dellia Cloud, M.D.      Assessment:  1.  Left Humeral Fracture S/P Fall 2.  Mechanical Fall 3.  Frontal Brain Mass- Epidermoid or Arachnoid Cyst vs Encephalomalacia from a previous Infarction 4.  Encephalopathy- Acute Delirium from Pain Medication versus Chronic.     Plan:    The Orthopedic Surgeon, Dr. Luiz Blare, is to see for evaluation for possible surgery.  Pain control ordered.  MRI of Brain ordered to evaluate the Frontal brain area.  Medications reconciled. SCDs ordered for DVT prophylaxis.  Other plans as per orders.    CODE STATUS:      FULL CODE        Harpreet Signore C 11/26/2011, 12:38 AM

## 2011-11-27 MED ORDER — POTASSIUM CHLORIDE CRYS ER 20 MEQ PO TBCR
40.0000 meq | EXTENDED_RELEASE_TABLET | Freq: Two times a day (BID) | ORAL | Status: AC
  Administered 2011-11-27 (×2): 40 meq via ORAL
  Filled 2011-11-27 (×2): qty 2

## 2011-11-27 NOTE — Progress Notes (Signed)
Clinical Social Worker contacted pt daughter to discuss discharge plans. Pt daughter requested this Clinical Social Worker e-mail the [facility] List. Clinical Social Worker e-mailed pt daughter SNF list. Pt daughter initially wanted pt information faxed to [facility], but pt daughter now agreeable to SNF search in all of [place] to have other options if [facility] unable to offer. Clinical Social Worker initiated SNF search in [employer] and will follow up with pt daughter in regard to bed offers. Clinical Social Worker to facilitate pt discharge needs when pt medically ready for discharge.  Jacklynn Lewis, MSW, LCSWA  Clinical Social Work [phone]

## 2011-11-27 NOTE — Progress Notes (Signed)
DAILY PROGRESS NOTE                              GENERAL INTERNAL MEDICINE [facility]  SUBJECTIVE Cystocele repair at bedside. Pain is controlled, does not have other complaints.  OBJECTIVE: BP 95/58  Pulse 72  Temp(Src) 97.2 F (36.2 C) (Axillary)  Resp 18  Ht 5\' 2"  (1.575 m)  Wt 43.3 kg (95 lb 7.4 oz)  BMI 17.46 kg/m2  SpO2 99%  Intake/Output Summary (Last 24 hours) at 11/27/11 0933 Last data filed at 11/27/11 0700  Gross per 24 hour  Intake   1075 ml  Output    600 ml  Net    475 ml                      Weight change: -3.5 kg (-7 lb 11.5 oz) Physical Exam: General: Alert and awake oriented x3 not in any acute distress. HEENT: anicteric sclera, pupils equal reactive to light and accommodation CVS: S1-S2 heard, no murmur rubs or gallops Chest: clear to auscultation bilaterally, no wheezing rales or rhonchi Abdomen:  normal bowel sounds, soft, nontender, nondistended, no organomegaly Neuro: Cranial nerves II-XII intact, no focal neurological deficits Extremities: no cyanosis, no clubbing or edema noted bilaterally   Lab Results:  [facility] 11/26/11 0645 11/25/11 1830  NA 141 140  K 3.2* 3.6  CL 107 104  CO2 25 26  GLUCOSE 97 97  BUN 13 17  CREATININE 0.48* 0.60  CALCIUM 8.9 9.4  MG -- --  PHOS -- --    Basename 11/26/11 0645 11/25/11 1830  WBC 8.6 10.3  NEUTROABS -- 8.6*  HGB 10.6* 12.7  HCT 32.5* 37.9  MCV 90.8 91.3  PLT 195 206   Micro Results: Recent Results (from the past 240 hour(s))  MRSA PCR SCREENING     Status: Normal   Collection Time   11/25/11 11:00 PM      Component Value Range Status Comment   MRSA by PCR NEGATIVE  NEGATIVE  Final     Studies/Results: Dg Lumbar Spine Complete  11/25/2011  *RADIOLOGY REPORT*  Clinical Data: Fall.  Low back pain.  Prior compression fractures.  LUMBAR SPINE - COMPLETE 4+ VIEW  Comparison: 08/29/2011  Findings: Levoconvex lumbar scoliosis noted along with a vascular calcifications and bony  demineralization.  Prior L3 compression fracture noted with prior augmentation.  A prior L1 compression fracture appears similar to the prior exams. I do not observe an acute lumbar spine compression fracture, and no lumbar subluxation is noted.  IMPRESSION:  1.  Stable appearance of old L1 compression fracture. 2.  Prior L3 compression fracture noted with vertebral augmentation. 3.  No definite acute fracture is identified in the lumbar spine. 4.  Bony demineralization. 5.  Vascular calcifications.  Original Report Authenticated By: Dellia Cloud, M.D.   Dg Wrist Complete Left  11/25/2011  *RADIOLOGY REPORT*  Clinical Data: Left wrist pain post fall  LEFT WRIST - COMPLETE 3+ VIEW  Comparison: 06/07/2010  Findings: Interval placement of a volar plate and multiple screws at distal left radius. Healed distal left radial metaphyseal fracture. Osseous demineralization. Minimal radiocarpal degenerative changes. Degenerative changes also present at first [facility] joint with radial subluxation of first metacarpal and significant spurring.  Orthopedic hardware appears intact. On the lateral view, a subtle distal ulnar metaphyseal fracture is questioned. This is not seen on the AP or oblique views. No radial  fracture identified.  IMPRESSION: Post ORIF of distal left radius. Osseous demineralization with scattered degenerative changes of the carpus as above. Cannot exclude nondisplaced distal left ulnar metaphyseal fracture; recommend clinical correlation for pain / tenderness at this site.  Original Report Authenticated By: Lollie Marrow, M.D.   Dg Hip Complete Left  11/25/2011  *RADIOLOGY REPORT*  Clinical Data: Fall.  Left hip pain.  LEFT HIP - COMPLETE 2+ VIEW  Comparison: CT scan dated 01/20 of 02/11  Findings: Asymmetric femoral head spurring is present, left greater than right.  This has a similar configuration to the prior CT scan, and I do not observe an acute left hip fracture.  Vascular calcifications  noted.  IMPRESSION:  1.  Considerable spurring of the left femoral head appears similar to the prior CT scan from 11/19/2009.  I do not observe a definite left hip fracture.  If there is a high clinical index of suspicion (for example if the patient is unable to bear weight), then CT the bony pelvis may be warranted.  Original Report Authenticated By: Dellia Cloud, M.D.   Ct Head Wo Contrast  11/25/2011  *RADIOLOGY REPORT*  Clinical Data: Unwitnessed fall.  CT HEAD WITHOUT CONTRAST  Technique:  Contiguous axial images were obtained from the base of the skull through the vertex without contrast.  Comparison: None.  Findings: The brain stem, cerebellum, cerebral peduncles, thalami, basal ganglia, basilar cisterns, and ventricular system appear unremarkable.  Periventricular and corona radiata white matter hypodensities are most compatible with chronic ischemic microvascular white matter disease.  Bilobed fluid density along the left frontal lobe measuring 2.6 x 3.0 cm on image 19 of series 2 is noted.  This could represent an arachnoid cyst, epidermoid cyst, or residua from prior encephalomalacia/infarct.  This could be further assessed with dedicated MRI.  It does not appear acute.  On images 13-15 of series 2, the basilar artery and left posterior cerebral artery appears slightly dense.  No intracranial hemorrhage or acute CVA is observed.  Bony demineralization is present along the skull base.  There is mild chronic sphenoid and ethmoid sinusitis.  IMPRESSION:  1. Slightly dense basilar and left posterior cerebral artery. Although quite likely due to atherosclerosis, if the patient has posterior fossa symptoms, MR angiography may be warranted for further characterization to exclude intravascular thrombus. 2.  Fluid density lesion along the periphery left frontal lobe may represent an arachnoid cyst, and there might cyst, or focal encephalomalacia from a prior infarct.  This could be further worked up  with dedicated MRI. 3.  Bony demineralization. 4.  Mild chronic sphenoid and ethmoid sinusitis.  Original Report Authenticated By: Dellia Cloud, M.D.   Mr Laqueta Jean Wo Contrast  11/26/2011  *RADIOLOGY REPORT*  Clinical Data: Abnormality left frontal lobe on recent CT. Unwitnessed fall.  MRI HEAD WITHOUT AND WITH CONTRAST  Technique:  Multiplanar, multiecho pulse sequences of the brain and surrounding structures were obtained according to standard protocol without and with intravenous contrast  Contrast: 10mL MULTIHANCE GADOBENATE DIMEGLUMINE 529 MG/ML IV SOLN  Comparison: 11/25/2011 CT.  No comparison MR.  Findings: Motion degraded exam.  Anterior left frontal lobe extra-axial appearing 3.4 x 2.3 x 2.9 cm cystic lesion with a septation and minimal surrounding T2 altered signal intensity.  No enhancement.  No restricted motion.  This may represent a minimally complex arachnoid cyst.  No erosion of the calvarium as can be seen with longstanding arachnoid cyst. Stability can be confirmed on follow-up.  No  acute infarct.  No intracranial hemorrhage.  Global atrophy.  Ventricular prominence probably related to atrophy rather than hydrocephalus.  Moderate small vessel disease type changes.  Cervical spondylotic changes with spinal stenosis and cord flattening C4-5.  Degenerative changes facet joints upper cervical spine greater on the right.  Atherosclerotic type changes of the vertebral arteries and internal carotid arteries.  Major intracranial vascular structures are patent.  Minimal to mild paranasal sinus mucosal thickening.  Partially empty sella incidentally noted.  IMPRESSION: Motion degraded exam.  Anterior left frontal lobe extra-axial appearing 3.4 x 2.3 x 2.9 cm cystic lesion with a septation and minimal surrounding T2 altered signal intensity.  No enhancement.  No restricted motion suggestive of a minimally complex arachnoid cyst.  No acute infarct.  No intracranial hemorrhage.  Global atrophy.   Moderate small vessel disease type changes.  Cervical spondylotic changes with spinal stenosis and cord flattening C4-5.  Degenerative changes facet joints upper cervical spine greater on the right.  Atherosclerotic type changes of the vertebral arteries and internal carotid arteries.  Major intracranial vascular structures are patent.  Minimal to mild paranasal sinus mucosal thickening.  Original Report Authenticated By: Fuller Canada, M.D.   Dg Humerus Left  11/25/2011  *RADIOLOGY REPORT*  Clinical Data: Fall.  Left humeral pain.  LEFT HUMERUS - 2+ VIEW  Comparison: None.  Findings: There is a fracture the greater tuberosity of the left humerus, with a concomitant suspected fracture of the anatomic neck of the humerus.  Reduced mobility noted.  No glenohumeral dislocation is observed.  IMPRESSION:  1.  Greater tuberosity fracture of the humerus, with equivocal fracture of the anatomic neck.  Original Report Authenticated By: Dellia Cloud, M.D.   Medications: Scheduled Meds:    . aspirin  325 mg Oral Daily  . calcitonin (salmon)  1 spray Alternating Nares Daily  . calcium-vitamin D  1 tablet Oral BID  . cholecalciferol  1,000 Units Oral Daily  . ezetimibe-simvastatin  1 tablet Oral QHS  . gadobenate dimeglumine  10 mL Intravenous Once  . levothyroxine  88 mcg Oral QAC breakfast  . nicotine  7 mg Transdermal Daily  . potassium chloride  40 mEq Oral BID   Continuous Infusions:    . sodium chloride 75 mL/hr at 11/27/11 0600   PRN Meds:.acetaminophen, acetaminophen, alum & mag hydroxide-simeth, fentaNYL, HYDROmorphone, ondansetron (ZOFRAN) IV, ondansetron, oxyCODONE, zolpidem  ASSESSMENT & PLAN: Active Problems:  Osteoporosis  Closed left humeral fracture  Falls frequently  Abnormal CT scan of head   Left humeral fracture Patient evaluated by Dr. Luiz Blare from orthopedic surgery. He recommended physical therapy and non-surgical intervention by a splint, and monitor the left  wrist Velcro splint as needed.  Frequent falls Patient had frequent falls which appears to be on mechanical from generalized weakness and chronic debility. Patient came in to the hospital more than once with fractures after mechanical fall the patient did not lose her consciousness or did not have any type of prodrome before the fall. Patient is on telemetry acute coronary syndrome be ruled out. Since MRI of the head ruled out acute stokes.  Abnormal CT scan of the head CT scan of the head showed fluid-filled density likely subarachnoid cyst. MRI of the brain was done and showed minimally complex arachnoid cyst measures 3.4 x 2.3 x 2.9 centimeter. Because of patient's age and other comorbidities, this was discussed with her and her and no intervention or further workup intended.  Osteoporosis Continue patient's calcium and vitamin  D supplementation.  Disposition Likely skilled nursing facility, we'll wait for physical therapy evaluation.   LOS: 2 days   Tommi Crepeau A 11/27/2011, 9:33 AM

## 2011-11-27 NOTE — Progress Notes (Signed)
Subjective: C/o moderate left shoulder pain. Denies hip pain.  Objective: Vital signs in last 24 hours: Temp:  [97.2 F (36.2 C)-98.4 F (36.9 C)] 97.2 F (36.2 C) (01/29 0454) Pulse Rate:  [72-87] 72  (01/29 0454) Resp:  [16-18] 18  (01/29 0454) BP: (95-125)/(58-72) 95/58 mmHg (01/29 0454) SpO2:  [95 %-99 %] 99 % (01/29 0454) Weight:  [43.3 kg (95 lb 7.4 oz)] 43.3 kg (95 lb 7.4 oz) (01/29 0454)  Intake/Output from previous day: 01/28 0701 - 01/29 0700 In: 1075 [P.O.:300; I.V.:775] Out: 600 [Urine:600] Intake/Output this shift:     Basename 11/26/11 0645 11/25/11 1830  HGB 10.6* 12.7    Basename 11/26/11 0645 11/25/11 1830  WBC 8.6 10.3  RBC 3.58* 4.15  HCT 32.5* 37.9  PLT 195 206    Basename 11/26/11 0645 11/25/11 1830  NA 141 140  K 3.2* 3.6  CL 107 104  CO2 25 26  BUN 13 17  CREATININE 0.48* 0.60  GLUCOSE 97 97  CALCIUM 8.9 9.4   Left shoulder:Tender over proximal humerus. Neurovascular intact Sensation intact distally No hip tenderness or pain with ROM. Assessment/Plan: Left proximal humerus FX PLAN: Cont sling May need SNF. Will sign off. Call us if needed. Dr Luiz Blare [phone] F/U Dr Luiz Blare in 10 days.   Thurlow Gallaga G 11/27/2011, 10:17 AM

## 2011-11-28 LAB — BASIC METABOLIC PANEL
CO2: 22 mEq/L (ref 19–32)
Chloride: 108 mEq/L (ref 96–112)
Creatinine, Ser: 0.35 mg/dL — ABNORMAL LOW (ref 0.50–1.10)
Glucose, Bld: 96 mg/dL (ref 70–99)
Sodium: 139 mEq/L (ref 135–145)

## 2011-11-28 NOTE — Evaluation (Signed)
Occupational Therapy Evaluation Patient Details Name: Emily Bautista MRN: [id number] DOB: April 23, 1922 Today's Date: 11/28/2011  Problem List:  Patient Active Problem List  Diagnoses  . Compression fracture  . Hypothyroid  . Osteoporosis with pathological fracture  . Tobacco abuse  . Asymptomatic bacteriuria  . Hyperlipidemia  . Osteoporosis  . Closed left humeral fracture  . Falls frequently  . Abnormal CT scan of head    Past Medical History:  Past Medical History  Diagnosis Date  . Hyperlipemia   . Thyroid disease   . Breast cancer   . Compression fracture of lumbar vertebra   . Hepatitis B infection   . TIA (transient ischemic attack)   . Hypothyroidism    Past Surgical History:  Past Surgical History  Procedure Date  . Mastectomy   . Wrist surgery   . Appendectomy     OT Assessment/Plan/Recommendation OT Assessment Clinical Impression Statement: Pt s/p multiple falls with most recent fall resulting in a L humerous fx which is being treated conservatively in a sling.  Pt with splint to L wrist also from old fall. Pt would benefit from cont. OT to address these falls and see how they are affecting her adls and attempt to increase safety and I with adls. OT Recommendation/Assessment: Patient will need skilled OT in the acute care venue OT Problem List: Decreased strength;Decreased range of motion;Decreased activity tolerance;Impaired balance (sitting and/or standing);Decreased coordination;Decreased cognition;Decreased safety awareness;Decreased knowledge of use of DME or AE;Decreased knowledge of precautions;Impaired UE functional use;Pain Barriers to Discharge: Decreased caregiver support Barriers to Discharge Comments: Pt essentially lives alone. OT Therapy Diagnosis : Generalized weakness;Acute pain OT Plan OT Frequency: Min 1X/week OT Treatment/Interventions: Self-care/ADL training;Therapeutic exercise;Therapeutic activities OT Recommendation Follow Up  Recommendations: Skilled nursing facility;Supervision/Assistance - 24 hour Equipment Recommended: Defer to next venue Individuals Consulted Consulted and Agree with Results and Recommendations: Patient OT Goals Acute Rehab OT Goals OT Goal Formulation: With patient Time For Goal Achievement: 2 weeks ADL Goals Pt Will Perform Grooming: with supervision;Standing at sink ADL Goal: Grooming - Progress: Goal set today Pt Will Perform Upper Body Bathing: with min assist;Sitting at sink ADL Goal: Upper Body Bathing - Progress: Goal set today Pt Will Perform Lower Body Bathing: with supervision;Sit to stand from chair;Sitting at sink ADL Goal: Lower Body Bathing - Progress: Goal set today Pt Will Perform Upper Body Dressing: with min assist;Sitting, chair;Other (comment) (including sling.) ADL Goal: Upper Body Dressing - Progress: Goal set today Pt Will Perform Lower Body Dressing: with set-up;Sit to stand from chair;Other (comment) (assist for fasteners only) ADL Goal: Lower Body Dressing - Progress: Goal set today Additional ADL Goal #1: Pt will complete all aspects of toileting with S. ADL Goal: Additional Goal #1 - Progress: Goal set today Arm Goals Additional Arm Goal #1: Will advance LUE as ordered by MDs. Arm Goal: Additional Goal #1 - Progress: Goal set today  OT Evaluation Precautions/Restrictions  Precautions Precautions: Fall Precaution Comments: Pt has had multiple falls resulting in L broken wrist,  L broken humerous,. Required Braces or Orthoses: Yes Other Brace/Splint: Pt with sling on L arm and old splint on l wrist. Restrictions Weight Bearing Restrictions: Yes LUE Weight Bearing: Non weight bearing Other Position/Activity Restrictions: sling at all times. Prior Functioning Home Living Lives With: Alone;Other (Comment) (retirement community.) Receives Help From: Other (Comment) (only gets help with meals at this place.) Type of Home: Independent living facility Home  Layout: One level;Other (Comment) (takes elevator to dining room.) Home Access:  Level entry Bathroom Shower/Tub: Tub/shower unit;Curtain Firefighter: Standard Home Adaptive Equipment: Shower chair with back;Walker - rolling Prior Function Level of Independence: Independent with basic ADLs;Independent with homemaking with ambulation;Requires assistive device for independence;Independent with transfers;Independent with gait Driving: No Vocation: Retired Comments: Pt fairly independent before with adls.  Walked with walker to dining room but did not use walker all the time.  Has had several falls.  No other help available at [facility]. ADL ADL Eating/Feeding: Simulated;Set up;Other (comment) (assist to cut since arm in sling) Where Assessed - Eating/Feeding: Chair Grooming: Performed;Wash/dry hands;Minimal assistance Grooming Details (indicate cue type and reason): min guard provided to stand at sink and groom.  Pt needs external support of some kind to stand or walk.  Pt with 2 LOB during eval both requiring mod assist to recover. Where Assessed - Grooming: Standing at sink Upper Body Bathing: Simulated;Minimal assistance Upper Body Bathing Details (indicate cue type and reason): difficulty secondary to sling. Where Assessed - Upper Body Bathing: Sitting, chair Lower Body Bathing: Simulated;Moderate assistance Lower Body Bathing Details (indicate cue type and reason): assist needed when standing to maintain balance. Where Assessed - Lower Body Bathing: Sit to stand from chair Upper Body Dressing: Simulated;Maximal assistance Upper Body Dressing Details (indicate cue type and reason): secondary to sling/pain Where Assessed - Upper Body Dressing: Sitting, chair Lower Body Dressing: Performed;Minimal assistance Lower Body Dressing Details (indicate cue type and reason): will need assist with fasterners Where Assessed - Lower Body Dressing: Sit to stand from chair Toilet Transfer:  Performed;Minimal assistance Toilet Transfer Details (indicate cue type and reason): cues to reach back and use rails. Toilet Transfer Method: Proofreader: Comfort height toilet;Grab bars Toileting - Clothing Manipulation: Performed;Minimal assistance Toileting - Clothing Manipulation Details (indicate cue type and reason): min assist needed for balance. Where Assessed - Toileting Clothing Manipulation: Sit to stand from 3-in-1 or toilet Toileting - Hygiene: Performed;Set up Where Assessed - Toileting Hygiene: Sit on 3-in-1 or toilet Tub/Shower Transfer: Not assessed Equipment Used: Other (comment) (walked with rolling O2 cart in R hand for balance. ) Ambulation Related to ADLs: Pt with decreaed balance walking during all adls.  Pt with 2 LOB requiring mod assist to recover.  pt will need some sort of assistive device to help assist with balance.  Pt remains a fall risk.  Pt c/o very mild pain when walking in r hip only. ADL Comments: Pt doing well with adls.  Techniques need to be taught re: dressing LUE since it is in sling and NWB. Vision/Perception  Vision - History Baseline Vision: No visual deficits Patient Visual Report: No change from baseline Vision - Assessment Eye Alignment: Within Functional Limits Vision Assessment: Vision not tested Perception Perception: Within Functional Limits Cognition Cognition Arousal/Alertness: Awake/alert Overall Cognitive Status: History of cognitive impairments History of Cognitive Impairment: Appears at baseline functioning Orientation Level: Disoriented to person;Disoriented to place;Disoriented to situation;Other (Comment) (mildly confused ) Cognition - Other Comments: Pt with some mild memory deficits noted during evaluation.  Pt inconsistent with answers about driving/home abilities.  Most of this may be promorbid.  Pt may warrent increased level of care at this point due to her falls and her decreased  cognition. Sensation/Coordination Sensation Light Touch: Appears Intact Coordination Gross Motor Movements are Fluid and Coordinated: No Fine Motor Movements are Fluid and Coordinated: No Coordination and Movement Description: Pt in sling in LUE imparing gross and fine motor movement Extremity Assessment RUE Assessment RUE Assessment: Within Functional Limits LUE Assessment  LUE Assessment: Exceptions to [facility] LUE Strength LUE Overall Strength: Deficits;Due to precautions;Unable to assess;Other (Comment) (Pt in sling due to fracture with conservative tx) Mobility  Bed Mobility Bed Mobility: Yes Supine to Sit: 4: Min assist;With rails;HOB flat;Other (comment) (vs to attempt to help push up with RUE) Sitting - Scoot to Edge of Bed: Other (comment) (Vcs on technique to scoot forward) Transfers Transfers: Yes Sit to Stand: 4: Min assist;With upper extremity assist;With armrests;From bed Sit to Stand Details (indicate cue type and reason): unsteady.  Cues to NOT use LUE to assist. Stand to Sit: 4: Min assist;To chair/3-in-1;With armrests;With upper extremity assist Stand to Sit Details: cues to sit with slow decent.   Exercises   End of Session OT - End of Session Equipment Utilized During Treatment: Other (comment) (L sling and splint.) Activity Tolerance: Patient tolerated treatment well Patient left: in chair;with call bell in reach;with family/visitor present;Other (comment) Psychiatrist present.) Nurse Communication: Mobility status for ambulation General Behavior During Session: [facility] for tasks performed Cognition: Impaired, at baseline   Hope Budds [phone] 11/28/2011, 11:54 AM

## 2011-11-28 NOTE — Progress Notes (Signed)
Clinical Social Worker provided pt daughter bed offers and pt daughter chooses bed at [facility]. Clinical Social Worker contacted [facility] who stated that bed available tomorrow. Clinical Social Worker to facilitate pt discharge needs when pt medically ready for discharge.   Jacklynn Lewis, MSW, LCSWA  Clinical Social Work [phone]

## 2011-11-28 NOTE — Progress Notes (Signed)
Sitter discontinued and camera on for safety.

## 2011-11-28 NOTE — Progress Notes (Signed)
DAILY PROGRESS NOTE                              GENERAL INTERNAL MEDICINE [facility]  SUBJECTIVE Doing well, left shoulder pain is well-controlled, denies any chest pain the first of breath  OBJECTIVE: BP 118/69  Pulse 77  Temp(Src) 98.2 F (36.8 C) (Oral)  Resp 16  Ht 5\' 2"  (1.575 m)  Wt 44.4 kg (97 lb 14.2 oz)  BMI 17.90 kg/m2  SpO2 95%  Intake/Output Summary (Last 24 hours) at 11/28/11 1240 Last data filed at 11/28/11 1000  Gross per 24 hour  Intake   1515 ml  Output    975 ml  Net    540 ml                      Weight change: 1.1 kg (2 lb 6.8 oz) Physical Exam: General: Alert and awake oriented x3 not in any acute distress. HEENT: anicteric sclera, pupils equal reactive to light and accommodation CVS: S1-S2 heard, no murmur rubs or gallops Chest: clear to auscultation bilaterally, no wheezing rales or rhonchi Abdomen:  normal bowel sounds, soft, nontender, nondistended, no organomegaly Neuro: Cranial nerves II-XII intact, no focal neurological deficits Extremities: Left arm in sling, and left wrist immobilized   Lab Results:  Basename 11/28/11 0533 11/26/11 0645  NA 139 141  K 3.9 3.2*  CL 108 107  CO2 22 25  GLUCOSE 96 97  BUN 10 13  CREATININE 0.35* 0.48*  CALCIUM 8.4 8.9  MG -- --  PHOS -- --    Basename 11/26/11 0645 11/25/11 1830  WBC 8.6 10.3  NEUTROABS -- 8.6*  HGB 10.6* 12.7  HCT 32.5* 37.9  MCV 90.8 91.3  PLT 195 206   Micro Results: Recent Results (from the past 240 hour(s))  MRSA PCR SCREENING     Status: Normal   Collection Time   11/25/11 11:00 PM      Component Value Range Status Comment   MRSA by PCR NEGATIVE  NEGATIVE  Final     Studies/Results: Dg Lumbar Spine Complete  11/25/2011  *RADIOLOGY REPORT*  Clinical Data: Fall.  Low back pain.  Prior compression fractures.  LUMBAR SPINE - COMPLETE 4+ VIEW  Comparison: 08/29/2011  Findings: Levoconvex lumbar scoliosis noted along with a vascular calcifications and bony  demineralization.  Prior L3 compression fracture noted with prior augmentation.  A prior L1 compression fracture appears similar to the prior exams. I do not observe an acute lumbar spine compression fracture, and no lumbar subluxation is noted.  IMPRESSION:  1.  Stable appearance of old L1 compression fracture. 2.  Prior L3 compression fracture noted with vertebral augmentation. 3.  No definite acute fracture is identified in the lumbar spine. 4.  Bony demineralization. 5.  Vascular calcifications.  Original Report Authenticated By: Dellia Cloud, M.D.   Dg Wrist Complete Left  11/25/2011  *RADIOLOGY REPORT*  Clinical Data: Left wrist pain post fall  LEFT WRIST - COMPLETE 3+ VIEW  Comparison: 06/07/2010  Findings: Interval placement of a volar plate and multiple screws at distal left radius. Healed distal left radial metaphyseal fracture. Osseous demineralization. Minimal radiocarpal degenerative changes. Degenerative changes also present at first [facility] joint with radial subluxation of first metacarpal and significant spurring.  Orthopedic hardware appears intact. On the lateral view, a subtle distal ulnar metaphyseal fracture is questioned. This is not seen on the AP or oblique  views. No radial fracture identified.  IMPRESSION: Post ORIF of distal left radius. Osseous demineralization with scattered degenerative changes of the carpus as above. Cannot exclude nondisplaced distal left ulnar metaphyseal fracture; recommend clinical correlation for pain / tenderness at this site.  Original Report Authenticated By: Lollie Marrow, M.D.   Dg Hip Complete Left  11/25/2011  *RADIOLOGY REPORT*  Clinical Data: Fall.  Left hip pain.  LEFT HIP - COMPLETE 2+ VIEW  Comparison: CT scan dated 01/20 of 02/11  Findings: Asymmetric femoral head spurring is present, left greater than right.  This has a similar configuration to the prior CT scan, and I do not observe an acute left hip fracture.  Vascular calcifications  noted.  IMPRESSION:  1.  Considerable spurring of the left femoral head appears similar to the prior CT scan from 11/19/2009.  I do not observe a definite left hip fracture.  If there is a high clinical index of suspicion (for example if the patient is unable to bear weight), then CT the bony pelvis may be warranted.  Original Report Authenticated By: Dellia Cloud, M.D.   Ct Head Wo Contrast  11/25/2011  *RADIOLOGY REPORT*  Clinical Data: Unwitnessed fall.  CT HEAD WITHOUT CONTRAST  Technique:  Contiguous axial images were obtained from the base of the skull through the vertex without contrast.  Comparison: None.  Findings: The brain stem, cerebellum, cerebral peduncles, thalami, basal ganglia, basilar cisterns, and ventricular system appear unremarkable.  Periventricular and corona radiata white matter hypodensities are most compatible with chronic ischemic microvascular white matter disease.  Bilobed fluid density along the left frontal lobe measuring 2.6 x 3.0 cm on image 19 of series 2 is noted.  This could represent an arachnoid cyst, epidermoid cyst, or residua from prior encephalomalacia/infarct.  This could be further assessed with dedicated MRI.  It does not appear acute.  On images 13-15 of series 2, the basilar artery and left posterior cerebral artery appears slightly dense.  No intracranial hemorrhage or acute CVA is observed.  Bony demineralization is present along the skull base.  There is mild chronic sphenoid and ethmoid sinusitis.  IMPRESSION:  1. Slightly dense basilar and left posterior cerebral artery. Although quite likely due to atherosclerosis, if the patient has posterior fossa symptoms, MR angiography may be warranted for further characterization to exclude intravascular thrombus. 2.  Fluid density lesion along the periphery left frontal lobe may represent an arachnoid cyst, and there might cyst, or focal encephalomalacia from a prior infarct.  This could be further worked up  with dedicated MRI. 3.  Bony demineralization. 4.  Mild chronic sphenoid and ethmoid sinusitis.  Original Report Authenticated By: Dellia Cloud, M.D.   Mr Laqueta Jean Wo Contrast  11/26/2011  *RADIOLOGY REPORT*  Clinical Data: Abnormality left frontal lobe on recent CT. Unwitnessed fall.  MRI HEAD WITHOUT AND WITH CONTRAST  Technique:  Multiplanar, multiecho pulse sequences of the brain and surrounding structures were obtained according to standard protocol without and with intravenous contrast  Contrast: 10mL MULTIHANCE GADOBENATE DIMEGLUMINE 529 MG/ML IV SOLN  Comparison: 11/25/2011 CT.  No comparison MR.  Findings: Motion degraded exam.  Anterior left frontal lobe extra-axial appearing 3.4 x 2.3 x 2.9 cm cystic lesion with a septation and minimal surrounding T2 altered signal intensity.  No enhancement.  No restricted motion.  This may represent a minimally complex arachnoid cyst.  No erosion of the calvarium as can be seen with longstanding arachnoid cyst. Stability can be confirmed on  follow-up.  No acute infarct.  No intracranial hemorrhage.  Global atrophy.  Ventricular prominence probably related to atrophy rather than hydrocephalus.  Moderate small vessel disease type changes.  Cervical spondylotic changes with spinal stenosis and cord flattening C4-5.  Degenerative changes facet joints upper cervical spine greater on the right.  Atherosclerotic type changes of the vertebral arteries and internal carotid arteries.  Major intracranial vascular structures are patent.  Minimal to mild paranasal sinus mucosal thickening.  Partially empty sella incidentally noted.  IMPRESSION: Motion degraded exam.  Anterior left frontal lobe extra-axial appearing 3.4 x 2.3 x 2.9 cm cystic lesion with a septation and minimal surrounding T2 altered signal intensity.  No enhancement.  No restricted motion suggestive of a minimally complex arachnoid cyst.  No acute infarct.  No intracranial hemorrhage.  Global atrophy.   Moderate small vessel disease type changes.  Cervical spondylotic changes with spinal stenosis and cord flattening C4-5.  Degenerative changes facet joints upper cervical spine greater on the right.  Atherosclerotic type changes of the vertebral arteries and internal carotid arteries.  Major intracranial vascular structures are patent.  Minimal to mild paranasal sinus mucosal thickening.  Original Report Authenticated By: Fuller Canada, M.D.   Dg Humerus Left  11/25/2011  *RADIOLOGY REPORT*  Clinical Data: Fall.  Left humeral pain.  LEFT HUMERUS - 2+ VIEW  Comparison: None.  Findings: There is a fracture the greater tuberosity of the left humerus, with a concomitant suspected fracture of the anatomic neck of the humerus.  Reduced mobility noted.  No glenohumeral dislocation is observed.  IMPRESSION:  1.  Greater tuberosity fracture of the humerus, with equivocal fracture of the anatomic neck.  Original Report Authenticated By: Dellia Cloud, M.D.   Medications: Scheduled Meds:    . aspirin  325 mg Oral Daily  . calcitonin (salmon)  1 spray Alternating Nares Daily  . calcium-vitamin D  1 tablet Oral BID  . cholecalciferol  1,000 Units Oral Daily  . ezetimibe-simvastatin  1 tablet Oral QHS  . levothyroxine  88 mcg Oral QAC breakfast  . nicotine  7 mg Transdermal Daily  . potassium chloride  40 mEq Oral BID   Continuous Infusions:    . sodium chloride 20 mL/hr at 11/28/11 1200   PRN Meds:.acetaminophen, acetaminophen, alum & mag hydroxide-simeth, HYDROmorphone, ondansetron (ZOFRAN) IV, ondansetron, oxyCODONE, zolpidem, DISCONTD: fentaNYL  ASSESSMENT & PLAN: Active Problems:  Osteoporosis  Closed left humeral fracture  Falls frequently  Abnormal CT scan of head   1. Fall/ Left humeral fracture Patient evaluated by Dr. Luiz Blare from orthopedic surgery. He recommended physical therapy and non-surgical intervention by a splint, and monitor the left wrist Velcro splint as  needed. Due to family request will request a second opinion from Dr. Bradly Bienenstock  Frequent falls Patient had frequent falls which appears to be on mechanical from generalized weakness and chronic debility. Patient came in to the hospital more than once with fractures after mechanical fall the patient did not lose her consciousness or did not have any type of prodrome before the fall. No events on telemetry and ACS ruled out.   MRI of the head ruled out acute stokes. Needs PT and short-term rehabilitation  Abnormal CT scan of the head  MRI of the brain was done and showed minimally complex arachnoid cyst measures 3.4 x 2.3 x 2.9 centimeter. Because of patient's age and other comorbidities, this was discussed with her and her and no intervention or further workup intended.  Osteoporosis Continue  patient's calcium and vitamin D supplementation, FU with PCP to evaluate for bisphosphonates  Disposition Likely skilled nursing facility, awaiting PT /OT eval.   LOS: 3 days   Amaury Kuzel 11/28/2011, 12:40 PM

## 2011-11-28 NOTE — Progress Notes (Signed)
Pt is a current smoker of 1 ppd as well as using smokeless tobacco. She has a patch on. She is not in action stage and not interested in quitting at this time. Referred to 1-800 quit now for f/u and support. Reviewed and gave pt Written eduation/contact info.

## 2011-11-28 NOTE — Progress Notes (Deleted)
Physical Therapy Evaluation Patient Details Name: Emily Bautista MRN: [id number] DOB: 1922-10-01 Today's Date: 11/28/2011  Problem List:  Patient Active Problem List  Diagnoses  . Compression fracture  . Hypothyroid  . Osteoporosis with pathological fracture  . Tobacco abuse  . Asymptomatic bacteriuria  . Hyperlipidemia  . Osteoporosis  . Closed left humeral fracture  . Falls frequently  . Abnormal CT scan of head    Past Medical History:  Past Medical History  Diagnosis Date  . Hyperlipemia   . Thyroid disease   . Breast cancer   . Compression fracture of lumbar vertebra   . Hepatitis B infection   . TIA (transient ischemic attack)   . Hypothyroidism    Past Surgical History:  Past Surgical History  Procedure Date  . Mastectomy   . Wrist surgery   . Appendectomy     PT Assessment/Plan/Recommendation PT Assessment Clinical Impression Statement: pt is 78 y/o female admitted post fall with proximal humeral fx.  pt can benefit from PT on acute and for f/u at SNF to improve balance, gait stability and activity tolerance. PT Recommendation Follow Up Recommendations: Skilled nursing facility Equipment Recommended: Defer to next venue PT Goals  Acute Rehab PT Goals PT Goal Formulation: With patient Time For Goal Achievement: 7 days Pt will go Supine/Side to Sit: with supervision PT Goal: Supine/Side to Sit - Progress: Goal set today Pt will go Sit to Stand: with supervision PT Goal: Sit to Stand - Progress: Goal set today Pt will Transfer Bed to Chair/Chair to Bed: with supervision PT Transfer Goal: Bed to Chair/Chair to Bed - Progress: Goal set today Pt will Ambulate: 51 - 150 feet;with supervision;with least restrictive assistive device;with cane PT Goal: Ambulate - Progress: Goal set today  PT Evaluation Precautions/Restrictions  Precautions Precautions: Fall Precaution Comments: Pt has had multiple falls resulting in L broken wrist,  L broken  humerous,. Required Braces or Orthoses: Yes Other Brace/Splint: Pt with sling on L arm and old splint on l wrist. Restrictions Weight Bearing Restrictions: Yes LUE Weight Bearing: Non weight bearing Other Position/Activity Restrictions: sling at all times. Prior Functioning  Home Living Lives With: Alone;Other (Comment) (retirement community.) Receives Help From: Other (Comment) (only gets help with meals at this place.) Type of Home: Independent living facility Home Layout: One level;Other (Comment) (takes elevator to dining room.) Home Access: Level entry Bathroom Shower/Tub: Tub/shower unit;Curtain Firefighter: Standard Home Adaptive Equipment: Shower chair with back;Walker - rolling Prior Function Level of Independence: Independent with basic ADLs;Independent with homemaking with ambulation;Requires assistive device for independence;Independent with transfers;Independent with gait Driving: No Vocation: Retired Comments: Pt fairly independent before with adls.  Walked with walker to dining room but did not use walker all the time.  Has had several falls.  No other help available at [facility]. Cognition Cognition Arousal/Alertness: Awake/alert Overall Cognitive Status: History of cognitive impairments History of Cognitive Impairment: Appears at baseline functioning Orientation Level: Disoriented to person;Disoriented to place;Disoriented to situation;Other (Comment) (mildly confused ) Cognition - Other Comments: Pt with some mild memory deficits noted during evaluation.  Pt inconsistent with answers about driving/home abilities.  Most of this may be promorbid.  Pt may warrent increased level of care at this point due to her falls and her decreased cognition. Sensation/Coordination Sensation Light Touch: Appears Intact Coordination Gross Motor Movements are Fluid and Coordinated: No Fine Motor Movements are Fluid and Coordinated: No Coordination and Movement Description: Pt in  sling in LUE imparing gross and fine motor  movement Extremity Assessment RUE Assessment RUE Assessment: Within Functional Limits LUE Assessment LUE Assessment: Exceptions to [facility] LUE Strength LUE Overall Strength: Deficits;Due to precautions;Unable to assess;Other (Comment) (Pt in sling due to fracture with conservative tx) RLE Assessment RLE Assessment: Within Functional Limits LLE Assessment LLE Assessment: Within Functional Limits (no s/s of hip fx with gait) Mobility (including Balance) Bed Mobility Bed Mobility: Yes Supine to Sit: 4: Min assist;With rails;HOB flat;Other (comment) (vs to attempt to help push up with RUE) Supine to Sit Details (indicate cue type and reason): vc's for UE assist Sitting - Scoot to Edge of Bed: Other (comment) (Vcs on technique to scoot forward) Transfers Transfers: Yes Sit to Stand: 4: Min assist;With upper extremity assist;With armrests;From bed Sit to Stand Details (indicate cue type and reason): manual A to help pt forward over her BOS Stand to Sit: 4: Min assist;To chair/3-in-1;With armrests;With upper extremity assist Stand to Sit Details: cues to sit with slow decent.   Ambulation/Gait Ambulation/Gait: Yes Ambulation/Gait Assistance: 3: Mod assist (without A device, and min assist with A device (O2 caddy)) Ambulation/Gait Assistance Details (indicate cue type and reason): on 2 occ, pt would have fallen during directional changes, generally unsteady whether with/without A device, but much better with the device Ambulation Distance (Feet): 80 Feet Assistive device: 2 person hand held assist;1 person hand held assist;Other (Comment) (and portable O2 caddy as unilateral device) Gait Pattern: Step-to pattern;Decreased step length - right;Decreased stride length;Shuffle Stairs: No  Posture/Postural Control Posture/Postural Control: No significant limitations Exercise    End of Session PT - End of Session Activity Tolerance: Patient tolerated  treatment well Patient left: in chair;with call bell in reach Nurse Communication: Mobility status for transfers;Mobility status for ambulation General Behavior During Session: [facility] for tasks performed Cognition: Impaired, at baseline  Frederich Montilla, Eliseo Gum 11/28/2011, 1:22 PM  11/28/2011  [place] Bing, PT [phone] (pager)

## 2011-11-28 NOTE — Evaluation (Addendum)
Physical Therapy Evaluation Patient Details Name: Emily Bautista MRN: [id number] DOB: 1922/03/11 Today's Date: 11/29/2011  Problem List:  Patient Active Problem List  Diagnoses  . Compression fracture  . Hypothyroid  . Osteoporosis with pathological fracture  . Tobacco abuse  . Asymptomatic bacteriuria  . Hyperlipidemia  . Osteoporosis  . Closed left humeral fracture  . Falls frequently  . Abnormal CT scan of head    Past Medical History:  Past Medical History  Diagnosis Date  . Hyperlipemia   . Thyroid disease   . Breast cancer   . Compression fracture of lumbar vertebra   . Hepatitis B infection   . TIA (transient ischemic attack)   . Hypothyroidism    Past Surgical History:  Past Surgical History  Procedure Date  . Mastectomy   . Wrist surgery   . Appendectomy     PT Assessment/Plan/Recommendation PT Assessment PT Recommendation/Assessment: Patient will need skilled PT in the acute care venue PT Problem List: Decreased strength;Decreased activity tolerance;Decreased balance;Decreased mobility;Decreased knowledge of precautions;Pain PT Therapy Diagnosis : Difficulty walking;Generalized weakness PT Plan PT Frequency: Min 3X/week PT Treatment/Interventions: Gait training;Functional mobility training;Therapeutic activities;Balance training;Patient/family education;DME instruction PT Recommendation Follow Up Recommendations: Skilled nursing facility Equipment Recommended: Defer to next venue PT Goals  Acute Rehab PT Goals PT Goal Formulation: With patient Time For Goal Achievement: 7 days Pt will go Supine/Side to Sit: with supervision Pt will go Sit to Stand: with supervision Pt will Transfer Bed to Chair/Chair to Bed: with supervision Pt will Ambulate: 51 - 150 feet;with supervision;with least restrictive assistive device;with cane  PT Evaluation Precautions/Restrictions  Precautions Precautions: Fall Precaution Comments: Pt has had multiple falls  resulting in L broken wrist,  L broken humerous,. Required Braces or Orthoses: Yes Other Brace/Splint: Pt with sling on L arm and old splint on l wrist. Restrictions Weight Bearing Restrictions: Yes LUE Weight Bearing: Non weight bearing Other Position/Activity Restrictions: sling at all times. Prior Functioning  Home Living Lives With: Alone;Other (Comment) (retirement community.) Receives Help From: Other (Comment) (only gets help with meals at this place.) Type of Home: Independent living facility Home Layout: One level;Other (Comment) (takes elevator to dining room.) Home Access: Level entry Bathroom Shower/Tub: Tub/shower unit;Curtain Firefighter: Standard Home Adaptive Equipment: Shower chair with back;Walker - rolling Prior Function Level of Independence: Independent with basic ADLs;Independent with homemaking with ambulation;Requires assistive device for independence;Independent with transfers;Independent with gait Driving: No Vocation: Retired Producer, television/film/video: Awake/alert Overall Cognitive Status: History of cognitive impairments History of Cognitive Impairment: Appears at baseline functioning Orientation Level: Disoriented to person;Disoriented to place;Disoriented to situation;Other (Comment) (mildly confused ) Cognition - Other Comments: Pt with some mild memory deficits noted during evaluation.  Pt inconsistent with answers about driving/home abilities.  Most of this may be promorbid.  Pt may warrent increased level of care at this point due to her falls and her decreased cognition. Sensation/Coordination Sensation Light Touch: Appears Intact Coordination Gross Motor Movements are Fluid and Coordinated: No Fine Motor Movements are Fluid and Coordinated: No Coordination and Movement Description: Pt in sling in LUE imparing gross and fine motor movement Extremity Assessment RUE Assessment RUE Assessment: Within Functional Limits LUE  Assessment LUE Assessment: Exceptions to [facility] LUE Strength LUE Overall Strength: Deficits;Due to precautions;Unable to assess;Other (Comment) (Pt in sling due to fracture with conservative tx) RLE Assessment RLE Assessment: Within Functional Limits LLE Assessment LLE Assessment: Within Functional Limits (no s/s of hip fx with gait) Mobility (including Balance) Bed Mobility Bed  Mobility: Yes Supine to Sit: 4: Min assist;With rails;HOB flat;Other (comment) (vs to attempt to help push up with RUE) Sitting - Scoot to Edge of Bed: Other (comment) (Vcs on technique to scoot forward) Transfers Transfers: Yes Sit to Stand: 4: Min assist;With upper extremity assist;With armrests;From bed Stand to Sit: 4: Min assist;To chair/3-in-1;With armrests;With upper extremity assist Ambulation/Gait Ambulation/Gait: Yes Ambulation/Gait Assistance: 3: Mod assist (without A device, and min assist with A device (O2 caddy)) Assistive device: 2 person hand held assist;1 person hand held assist;Other (Comment) (and portable O2 caddy as unilateral device) Gait Pattern: Step-to pattern;Decreased step length - right;Decreased stride length;Shuffle Stairs: No  Posture/Postural Control Posture/Postural Control: No significant limitations Exercise    End of Session PT - End of Session Activity Tolerance: Patient tolerated treatment well Patient left: in chair;with call bell in reach Nurse Communication: Mobility status for transfers;Mobility status for ambulation General Behavior During Session: [facility] for tasks performed Cognition: Impaired, at baseline  Joshlynn Alfonzo, Eliseo Gum 11/29/2011, 9:33 AM

## 2011-11-28 NOTE — Progress Notes (Signed)
CHART REVIEWED I DID NOT HAVE TIME TO GET BY TODAY JUST GOT OUT OF OR WILL COME BY TOMORROW AND DISCUSS WITH PATIENT ABOUT HER SHOULDER AND WRIST I HAVE TAKEN CARE OF HER IN THE PAST AND WOULD BE HAPPY TO TALK WITH HER ABOUT HER BROKEN SHOULDER. I SHOULD BE BY TOMORROW 1/31

## 2011-11-29 NOTE — Progress Notes (Signed)
DAILY PROGRESS NOTE                              [facility]AL [facility]NAL [facility]  SUBJECTIVE Doing well, left shoulder pain is well-controlled, exacerbated with movement,  denies any chest pain the first of breath  OBJECTIVE: BP 111/83  Pulse 80  Temp(Src) 97.2 F (36.2 C) (Oral)  Resp 18  Ht 5\' 2"  (1.575 m)  Wt 46.4 kg (102 lb 4.7 oz)  BMI 18.71 kg/m2  SpO2 96%  Intake/Output Summary (Last 24 hours) at 11/29/11 1509 Last data filed at 11/29/11 1221  Gross per 24 hour  Intake    880 ml  Output   1051 ml  Net   -171 ml                      Weight change: 2 kg (4 lb 6.6 oz) Physical Exam: General: Alert and awake oriented x3 not in any acute distress. HEENT: anicteric sclera, pupils equal reactive to light and accommodation CVS: S1-S2 heard, no murmur rubs or gallops Chest: clear to auscultation bilaterally, no wheezing rales or rhonchi Abdomen:  normal bowel sounds, soft, nontender, nondistended, no organomegaly Neuro: Cranial nerves II-XII intact, no focal neurological deficits Extremities: Left arm in sling, and left wrist immobilized   Lab Results:  Basename 11/28/11 0533  NA 139  K 3.9  CL 108  CO2 22  GLUCOSE 96  BUN 10  CREATININE 0.35*  CALCIUM 8.4  MG --  PHOS --   No results found for this basename: WBC:2,NEUTROABS:2,HGB:2,HCT:2,MCV:2,PLT:2 in the last 72 hours Micro Results: Recent Results (from the past 240 hour(s))  MRSA PCR SCREENING     Status: Normal   Collection Time   11/25/11 11:00 PM      Component Value Range Status Comment   MRSA by PCR NEGATIVE  NEGATIVE  Final     Studies/Results: Dg Lumbar Spine Complete  11/25/2011  *RADIOLOGY REPORT*  Clinical Data: Fall.  Low back pain.  Prior compression fractures.  LUMBAR SPINE - COMPLETE 4+ VIEW  Comparison: 08/29/2011  Findings: Levoconvex lumbar scoliosis noted along with a vascular calcifications and bony demineralization.  Prior L3 compression fracture noted with prior  augmentation.  A prior L1 compression fracture appears similar to the prior exams. I do not observe an acute lumbar spine compression fracture, and no lumbar subluxation is noted.  IMPRESSION:  1.  Stable appearance of old L1 compression fracture. 2.  Prior L3 compression fracture noted with vertebral augmentation. 3.  No definite acute fracture is identified in the lumbar spine. 4.  Bony demineralization. 5.  Vascular calcifications.  Original Report Authenticated By: Dellia Cloud, M.D.   Dg Wrist Complete Left  11/25/2011  *RADIOLOGY REPORT*  Clinical Data: Left wrist pain post fall  LEFT WRIST - COMPLETE 3+ VIEW  Comparison: 06/07/2010  Findings: Interval placement of a volar plate and multiple screws at distal left radius. Healed distal left radial metaphyseal fracture. Osseous demineralization. Minimal radiocarpal degenerative changes. Degenerative changes also present at first [facility] joint with radial subluxation of first metacarpal and significant spurring.  Orthopedic hardware appears intact. On the lateral view, a subtle distal ulnar metaphyseal fracture is questioned. This is not seen on the AP or oblique views. No radial fracture identified.  IMPRESSION: Post ORIF of distal left radius. Osseous demineralization with scattered degenerative changes of the carpus as above. Cannot exclude nondisplaced distal left  ulnar metaphyseal fracture; recommend clinical correlation for pain / tenderness at this site.  Original Report Authenticated By: Lollie Marrow, M.D.   Dg Hip Complete Left  11/25/2011  *RADIOLOGY REPORT*  Clinical Data: Fall.  Left hip pain.  LEFT HIP - COMPLETE 2+ VIEW  Comparison: CT scan dated 01/20 of 02/11  Findings: Asymmetric femoral head spurring is present, left greater than right.  This has a similar configuration to the prior CT scan, and I do not observe an acute left hip fracture.  Vascular calcifications noted.  IMPRESSION:  1.  Considerable spurring of the left femoral head  appears similar to the prior CT scan from 11/19/2009.  I do not observe a definite left hip fracture.  If there is a high clinical index of suspicion (for example if the patient is unable to bear weight), then CT the bony pelvis may be warranted.  Original Report Authenticated By: Dellia Cloud, M.D.   Ct Head Wo Contrast 11/25/2011   IMPRESSION:  1. Slightly dense basilar and left posterior cerebral artery. Although quite likely due to atherosclerosis, if the patient has posterior fossa symptoms, MR angiography may be warranted for further characterization to exclude intravascular thrombus. 2.  Fluid density lesion along the periphery left frontal lobe may represent an arachnoid cyst, and there might cyst, or focal encephalomalacia from a prior infarct.  This could be further worked up with dedicated MRI. 3.  Bony demineralization. 4.  Mild chronic sphenoid and ethmoid sinusitis.  Original Report Authenticated By: Dellia Cloud, M.D.   Mr Laqueta Jean Wo Contrast 11/26/2011  *RADIOLOGY REPORT IMPRESSION: Motion degraded exam.  Anterior left frontal lobe extra-axial appearing 3.4 x 2.3 x 2.9 cm cystic lesion with a septation and minimal surrounding T2 altered signal intensity.  No enhancement.  No restricted motion suggestive of a minimally complex arachnoid cyst.  No acute infarct.  No intracranial hemorrhage.  Global atrophy.  Moderate small vessel disease type changes.  Cervical spondylotic changes with spinal stenosis and cord flattening C4-5.  Degenerative changes facet joints upper cervical spine greater on the right.  Atherosclerotic type changes of the vertebral arteries and internal carotid arteries.  Major intracranial vascular structures are patent.  Minimal to mild paranasal sinus mucosal thickening.  Original Report Authenticated By: Fuller Canada, M.D.   Dg Humerus Left  11/25/2011  *RADIOLOGY REPORT  IMPRESSION:  1.  Greater tuberosity fracture of the humerus, with equivocal fracture  of the anatomic neck.  Original Report Authenticated By: Dellia Cloud, M.D.   Medications: Scheduled Meds:    . aspirin  325 mg Oral Daily  . calcitonin (salmon)  1 spray Alternating Nares Daily  . calcium-vitamin D  1 tablet Oral BID  . cholecalciferol  1,000 Units Oral Daily  . ezetimibe-simvastatin  1 tablet Oral QHS  . levothyroxine  88 mcg Oral QAC breakfast  . nicotine  7 mg Transdermal Daily   Continuous Infusions:    . sodium chloride 20 mL/hr (11/28/11 1757)   PRN Meds:.acetaminophen, acetaminophen, alum & mag hydroxide-simeth, HYDROmorphone, ondansetron (ZOFRAN) IV, ondansetron, oxyCODONE, zolpidem  ASSESSMENT & PLAN: Active Problems:  Osteoporosis  Closed left humeral fracture  Falls frequently  Abnormal CT scan of head   1. Fall/ Left humeral fracture Patient evaluated by Dr. Luiz Blare from orthopedic surgery. He recommended physical therapy and non-surgical intervention by a splint, and monitor the left wrist Velcro splint as needed. Due to family request a second opinion has been requested from Dr. Merlyn Albert  Ortmann, await input.  Frequent falls Patient had frequent falls which appears to be on mechanical from generalized weakness and chronic debility. Patient came in to the hospital more than once with fractures after mechanical fall the patient did not lose her consciousness or did not have any type of prodrome before the fall. No events on telemetry and ACS ruled out.   MRI of the head ruled out acute stokes. Needs PT and short-term rehabilitation  Abnormal CT scan of the head  MRI of the brain was done and showed minimally complex arachnoid cyst measures 3.4 x 2.3 x 2.9 centimeter. Because of patient's age and other comorbidities, this was discussed with her and her and no intervention or further workup intended.  Osteoporosis Continue patient's calcium and vitamin D supplementation, FU with PCP to evaluate for bisphosphonates  Disposition skilled  nursing facility pending evaluation by Dr. Melvyn Novas.   LOS: 4 days   Burlie Cajamarca 11/29/2011, 3:09 PM

## 2011-11-29 NOTE — Progress Notes (Signed)
Physical Therapy Treatment Patient Details Name: Emily Bautista MRN: [id number] DOB: April 25, 1922 Today's Date: 11/29/2011  PT Assessment/Plan  PT - Assessment/Plan Comments on Treatment Session: pt will need continued rehab to address gait instability and use of various unilateral devices. PT Plan: Discharge plan remains appropriate Follow Up Recommendations: Skilled nursing facility Equipment Recommended: Defer to next venue PT Goals  Acute Rehab PT Goals PT Goal: Sit to Stand - Progress: Progressing toward goal PT Transfer Goal: Bed to Chair/Chair to Bed - Progress: Progressing toward goal PT Goal: Ambulate - Progress: Progressing toward goal  PT Treatment Precautions/Restrictions  Precautions Precautions: Fall Precaution Comments: Pt has had multiple falls resulting in L broken wrist,  L broken humerous,. Required Braces or Orthoses: Yes Other Brace/Splint: sling on the left Restrictions Weight Bearing Restrictions: Yes LUE Weight Bearing: Non weight bearing Other Position/Activity Restrictions: sling at all times. Mobility (including Balance) Bed Mobility Bed Mobility: No Transfers Transfers: Yes Sit to Stand: 4: Min assist;With upper extremity assist;From chair/3-in-1 Sit to Stand Details (indicate cue type and reason): vc's for hand placement Stand to Sit: 4: Min assist;With upper extremity assist;To chair/3-in-1 Stand to Sit Details: vc's to back up into proper position with  A device, hand placement Ambulation/Gait Ambulation/Gait: Yes Ambulation/Gait Assistance: 4: Min assist;Other (comment) (to occaisional min guard A) Ambulation/Gait Assistance Details (indicate cue type and reason): still mildly unsteady with the A devices (hemiwalker vs. standard cane).  extensive vc/ visual cues for sequencing with the cane/HW.  Pt improved but had difficutly with the devices in general Ambulation Distance (Feet): 200 Feet Assistive device: Hemi-walker;Straight cane Gait Pattern:  Step-to pattern;Decreased step length - right;Decreased step length - left;Decreased stride length;Scissoring;Trunk flexed Stairs: No  Posture/Postural Control Posture/Postural Control: No significant limitations Balance Balance Assessed: Yes Static Standing Balance Static Standing - Balance Support: Right upper extremity supported;During functional activity Static Standing - Level of Assistance: Other (comment) (min guard A) Exercise    End of Session PT - End of Session Activity Tolerance: Patient tolerated treatment well Patient left: in chair;with call bell in reach;with family/visitor present Nurse Communication: Mobility status for ambulation General Behavior During Session: [facility] for tasks performed  Railynn Ballo, Eliseo Gum 11/29/2011, 4:28 PM  11/29/2011  [place] Bing, PT [phone] (pager)

## 2011-11-29 NOTE — Progress Notes (Signed)
Clinical Social Worker continuing to follow for discharge planning. Pt and pt family have chosen bed at [facility]. Per MD, pt discharge to SNF pending evaluation by Dr. Melvyn Novas. Clinical Social Worker notified [employer]. Clinical Social Worker to facilitate pt discharge needs when pt medically ready for discharge.  Jacklynn Lewis, MSW, LCSWA  Clinical Social Work [phone]

## 2011-11-29 NOTE — Consult Note (Signed)
[id number] job id full consult note dictated  Pt seen/examined at bedside Family reassured of findings Would continue with nonop care of shoulder Wrist brace removed Family well established at [facility]  Will F/U with [place] in 7-10 days

## 2011-11-30 LAB — URINE MICROSCOPIC-ADD ON

## 2011-11-30 LAB — URINALYSIS, ROUTINE W REFLEX MICROSCOPIC
Nitrite: NEGATIVE
Protein, ur: NEGATIVE mg/dL
Specific Gravity, Urine: 1.014 (ref 1.005–1.030)
Urobilinogen, UA: 1 mg/dL (ref 0.0–1.0)

## 2011-11-30 MED ORDER — OXYCODONE HCL 5 MG PO TABS: 5.0000 mg | ORAL_TABLET | ORAL | Status: AC | PRN

## 2011-11-30 NOTE — Progress Notes (Signed)
0740 reported by another RN found patient sitting quietly on the floor in front of her chair. Patient stated " I slipped on the floor while trying to readjust my position in the chair. I fell softly on my butt, and am not in any pain, and I did not hit anything except my bottom but it doesn't hurt." Patient moving all extremities at baseline no overt skin breakdown noted. Doctor Jomarie Longs notified and spoken to orders to observe and monitor patient otherwise no further orders given. Patient vital signs 138/79 HR 89 temp 97.7. Patient curerntly eating breakfast no complaints of pain. Will continue to monitor.

## 2011-11-30 NOTE — Progress Notes (Signed)
Clinical Social Worker spoke with pt daughter this morning in regard to pt plan to discharge today and acquire about when pt daughter planning on being at the hospital. Pt daughter expressed frustration that pt had slipped out of her recliner last night and per pt daughter she had requested a sitter during hospitalization, but was told that it was a policy with SNF facilities that pt is to be without sitter prior to discharge. Clinical Social Worker attempted to clarify pt questions about the sitter and the facilities policy because SNF does not provide a sitter 24 hours a day. Pt daughter became verbally aggressive and hung up the telephone. Clinical Social Worker attempted to re-contact pt daughter without success. Clinical Social Worker contacted [facility] to inform facility of pt daughter concerns from here in the hospital in order for facility to discuss how facility meets pt with fall risks needs. Clinical Social Worker contacted pt son to inform pt son that whoever came to the hospital to transport patient would need the discharge packet to take to the facility. Clinical Social Worker eventually received return phone call from pt daughter who provided time that she was planning on being here at the hospital. Clinical Social Worker facilitated pt discharge needs including contacting facility and pt daughter transporting pt to [facility] by car at pt daughter request. No further needs at this time.  Jacklynn Lewis, MSW, LCSWA  Clinical Social Work [phone]

## 2011-11-30 NOTE — Consult Note (Signed)
NAMEJACALYNN, Emily Bautista NO.:  [id number]  MEDICAL RECORD NO.:  [id number]  LOCATION:  4728                         FACILITY:  [facility]  PHYSICIAN:  Madelynn Done, MD  DATE OF BIRTH:  1922-08-18  DATE OF CONSULTATION:  11/29/2011 DATE OF DISCHARGE:                                CONSULTATION   I was asked to see Emily Bautista for another opinion regarding the management of her left upper extremity predicament.  I know Emily Bautista very well.  I performed the open reduction and internal fixation of her displaced distal radius fracture.  She did very well.  The family was requesting to get an opinion from me since I have taken care of her in the past. They were comfortable with the opinion from Dr. Luiz Blare but just wanted me to see the patient since she was in the hospital having got to know the family during her course of care.  Her past medical history, past surgical history, medications, and allergies were reviewed in the chart.  On examination of the left upper extremity, the patient does have ecchymosis and swelling over the proximal humerus extending down into her elbow.  She does not have any open wounds or scars.  She does have limitations of movement in terms of shoulder abduction and internal and external rotation.  She will actively flex and extend her elbow.  She is able to actively flex and extend her wrist.  She is able to extend her thumb, extend her digits.  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  Her fingertips are warm and well perfused, good capillary refill.  Her radiographs in the [facility] do show the healed distal radius fracture with the volar plate fixation in place.  I do not see any evidence new acute fractures.  Her radiographs of the shoulder do show the minimally displaced greater tuberosity fracture without significant angulation of the humeral neck or head.  IMPRESSION:  Left  shoulder greater tuberosity fracture, minimally displaced.  PLAN:  Today, the findings were reviewed with Emily Bautista and family.  I would concur with the current recommendations and treating this in a nonoperative manner.  The patient will continue with the sling at all times.  Dr. Thomasena Edis is taking care of the entire family over the course of the years and they are going to follow up at [facility] in 7-10 days for repeat radiographs of the shoulder.  She may come out of the sling and work on her elbow flexion and extension as well as wrist mobility.  Given the lack of the real discomfort that she has in her wrist, I took the Velcro splint off today.  It was too big for her. It was really impairing her use of her hand.  I plan to see her back in followup for repeat radiographs of the shoulder. The family was reassured of the findings today.  They are going to come back and see me in followup.  All questions were answered and encouraged today.  No new medications were recommended.     Madelynn Done, MD  FWO/MEDQ  D:  11/29/2011  T:  11/30/2011  Job:  [id number]

## 2011-11-30 NOTE — Discharge Summary (Signed)
Physician Discharge Summary  Patient ID: CAITLYN BUCHANAN MRN: [id number] DOB/AGE: 02-15-1922 78 y.o.  Admit date: 11/25/2011 Discharge date: 11/30/2011  Primary Care Physician:  Junious Silk, MD, MD Orthopedic surgeon: Dr. Bradly Bienenstock  Discharge Diagnoses:   1. left humeral fracture status post fall 2. recent left wrist fracture status post ORIF 3. Osteoporosis 4. Frequent falls 5. Arachnoid cyst of brain 6. hypothyroidism  Medication List  As of 11/30/2011 11:50 AM   TAKE these medications         aspirin 325 MG tablet   Take 325 mg by mouth daily.      calcitonin (salmon) 200 UNIT/ACT nasal spray   Commonly known as: MIACALCIN/FORTICAL   Place 1 spray into the nose daily.      calcium-vitamin D 500-200 MG-UNIT per tablet   Commonly known as: OSCAL WITH D   Take 1 tablet by mouth 2 (two) times daily.      cholecalciferol 1000 UNITS tablet   Commonly known as: VITAMIN D   Take 1,000 Units by mouth daily.      ezetimibe-simvastatin 10-40 MG per tablet   Commonly known as: VYTORIN   Take 1 tablet by mouth at bedtime.      levothyroxine 88 MCG tablet   Commonly known as: SYNTHROID, LEVOTHROID   Take 88 mcg by mouth daily.      oxyCODONE 5 MG immediate release tablet   Commonly known as: Oxy IR/ROXICODONE   Take 1 tablet (5 mg total) by mouth every 4 (four) hours as needed.             Disposition and Follow-up:  Dr. Bradly Bienenstock in 7-10 days  Consults:  1. Dr. Jodi Geralds, orthopedics 2. second opinion Dr. Bradly Bienenstock [facility] orthopedics   Significant Diagnostic Studies:  Dg Lumbar Spine Complete 11/25/2011  *RADIOLOGY REPORT* IMPRESSION:  1.  Stable appearance of old L1 compression fracture. 2.  Prior L3 compression fracture noted with vertebral augmentation. 3.  No definite acute fracture is identified in the lumbar spine. 4.  Bony demineralization. 5.  Vascular calcifications.  Original Report Authenticated By: Dellia Cloud, M.D.   Dg  Wrist Complete Left 11/25/2011   IMPRESSION: Post ORIF of distal left radius. Osseous demineralization with scattered degenerative changes of the carpus as above. Cannot exclude nondisplaced distal left ulnar metaphyseal fracture; recommend clinical correlation for pain / tenderness at this site.  Original Report Authenticated By: Lollie Marrow, M.D.   Dg Hip Complete Left 11/25/2011   IMPRESSION:  1.  Considerable spurring of the left femoral head appears similar to the prior CT scan from 11/19/2009.  I do not observe a definite left hip fracture.  If there is a high clinical index of suspicion (for example if the patient is unable to bear weight), then CT the bony pelvis may be warranted.  Original Report Authenticated By: Dellia Cloud, M.D.   Ct Head Wo Contrast 11/25/2011  IMPRESSION:  1. Slightly dense basilar and left posterior cerebral artery. Although quite likely due to atherosclerosis, if the patient has posterior fossa symptoms, MR angiography may be warranted for further characterization to exclude intravascular thrombus. 2.  Fluid density lesion along the periphery left frontal lobe may represent an arachnoid cyst, and there might cyst, or focal encephalomalacia from a prior infarct.  This could be further worked up with dedicated MRI. 3.  Bony demineralization. 4.  Mild chronic sphenoid and ethmoid sinusitis.  Original Report Authenticated By: Dellia Cloud, M.D.  Mr Laqueta Jean Wo Contrast 11/26/2011   IMPRESSION: Motion degraded exam.  Anterior left frontal lobe extra-axial appearing 3.4 x 2.3 x 2.9 cm cystic lesion with a septation and minimal surrounding T2 altered signal intensity.  No enhancement.  No restricted motion suggestive of a minimally complex arachnoid cyst.  No acute infarct.  No intracranial hemorrhage.  Global atrophy.  Moderate small vessel disease type changes.  Cervical spondylotic changes with spinal stenosis and cord flattening C4-5.  Degenerative changes facet  joints upper cervical spine greater on the right.  Atherosclerotic type changes of the vertebral arteries and internal carotid arteries.  Major intracranial vascular structures are patent.  Minimal to mild paranasal sinus mucosal thickening.  Original Report Authenticated By: Fuller Canada, M.D.   Dg Humerus Left 11/25/2011  IMPRESSION:  1.  Greater tuberosity fracture of the humerus, with equivocal fracture of the anatomic neck.  Original Report Authenticated By: Dellia Cloud, M.D.    Brief H and P:Karmella F Virgil is an 78 y.o. female who was taken to the [facility] ED after suffering a fall with subsequent left shoulder pain. She was leaving the dining room at her Retirement home and reportedly tripped and fell onto her left side. In the ED an X-Ray was performed and a fracture of the anatomic neck and greater tuberosity of the Left humerus was found.  Hospital Course:  1.Closed left humeral fracture: For this she was seen by Dr. Luiz Blare from orthopedic surgery who recommended sling and gentle physical therapy, conservative nonoperative management. Since the family requested a second opinion from Dr.Ortmann, she was she was seen yesterday by him who concurred with Dr. Luiz Blare recommendations and recommended the sling at all times and recommended followup with orthopedics in 7-10 days for repeat radiographs of the shoulder. He also felt that she would be able to come out of the sling and work on her shoulder flexion and extension as well as wrist mobility. Right wrist fracture status post recent ORIF prior to admission by Dr. Melvyn Novas, he recommended removing the Velcro splint. 2. frequent falls this is felt to be secondary to deconditioning, chronic gait disorder and balance issues, was seen by physical therapy recommended skilled nursing facility for physical rehabilitation. She also had an MRI of her brain which revealed a minimally complex arachnoid cyst, due to patient's age and  comorbidities this was discussed the patient and daughter and  decision for no further workup or intervention regarding this was made by family. 3. Osteoporosis  recommended to continue calcium and vitamin D supplementation and advised to followup with primary care physician to evaluate for bisphosphonate.   Time spent on Discharge:  Signed: Shawnay Bramel [facility]  11/30/2011, 11:50 AM

## 2012-07-09 ENCOUNTER — Inpatient Hospital Stay (HOSPITAL_COMMUNITY): Payer: Medicare Other

## 2012-07-09 ENCOUNTER — Other Ambulatory Visit: Payer: Self-pay

## 2012-07-09 ENCOUNTER — Encounter (HOSPITAL_COMMUNITY): Payer: Self-pay | Admitting: *Deleted

## 2012-07-09 ENCOUNTER — Inpatient Hospital Stay (HOSPITAL_COMMUNITY): Payer: Medicare Other | Admitting: *Deleted

## 2012-07-09 ENCOUNTER — Emergency Department (HOSPITAL_COMMUNITY): Payer: Medicare Other

## 2012-07-09 ENCOUNTER — Inpatient Hospital Stay (HOSPITAL_COMMUNITY)
Admission: EM | Admit: 2012-07-09 | Discharge: 2012-07-11 | DRG: 481 | Disposition: A | Discharge status: Skilled Nursing Facility | Payer: Medicare Other | Attending: Internal Medicine | Admitting: Internal Medicine

## 2012-07-09 ENCOUNTER — Encounter (HOSPITAL_COMMUNITY)
Admission: EM | Disposition: A | Discharge status: Skilled Nursing Facility | Payer: Self-pay | Source: Home / Self Care | Attending: Internal Medicine

## 2012-07-09 DIAGNOSIS — K5900 Constipation, unspecified: Secondary | ICD-10-CM

## 2012-07-09 DIAGNOSIS — E039 Hypothyroidism, unspecified: Secondary | ICD-10-CM

## 2012-07-09 DIAGNOSIS — F17200 Nicotine dependence, unspecified, uncomplicated: Secondary | ICD-10-CM | POA: Diagnosis present

## 2012-07-09 DIAGNOSIS — F0390 Unspecified dementia without behavioral disturbance: Secondary | ICD-10-CM

## 2012-07-09 DIAGNOSIS — Z853 Personal history of malignant neoplasm of breast: Secondary | ICD-10-CM

## 2012-07-09 DIAGNOSIS — M810 Age-related osteoporosis without current pathological fracture: Secondary | ICD-10-CM | POA: Diagnosis present

## 2012-07-09 DIAGNOSIS — W1809XA Striking against other object with subsequent fall, initial encounter: Secondary | ICD-10-CM | POA: Diagnosis present

## 2012-07-09 DIAGNOSIS — E785 Hyperlipidemia, unspecified: Secondary | ICD-10-CM

## 2012-07-09 DIAGNOSIS — S72143A Displaced intertrochanteric fracture of unspecified femur, initial encounter for closed fracture: Principal | ICD-10-CM

## 2012-07-09 DIAGNOSIS — Z79899 Other long term (current) drug therapy: Secondary | ICD-10-CM

## 2012-07-09 DIAGNOSIS — Y9210 Unspecified residential institution as the place of occurrence of the external cause: Secondary | ICD-10-CM | POA: Diagnosis present

## 2012-07-09 DIAGNOSIS — J449 Chronic obstructive pulmonary disease, unspecified: Secondary | ICD-10-CM

## 2012-07-09 DIAGNOSIS — S32000A Wedge compression fracture of unspecified lumbar vertebra, initial encounter for closed fracture: Secondary | ICD-10-CM

## 2012-07-09 DIAGNOSIS — Z87311 Personal history of (healed) other pathological fracture: Secondary | ICD-10-CM

## 2012-07-09 DIAGNOSIS — R930 Abnormal findings on diagnostic imaging of skull and head, not elsewhere classified: Secondary | ICD-10-CM

## 2012-07-09 DIAGNOSIS — S72142A Displaced intertrochanteric fracture of left femur, initial encounter for closed fracture: Secondary | ICD-10-CM

## 2012-07-09 DIAGNOSIS — Z8673 Personal history of transient ischemic attack (TIA), and cerebral infarction without residual deficits: Secondary | ICD-10-CM

## 2012-07-09 DIAGNOSIS — M169 Osteoarthritis of hip, unspecified: Secondary | ICD-10-CM | POA: Diagnosis present

## 2012-07-09 DIAGNOSIS — D62 Acute posthemorrhagic anemia: Secondary | ICD-10-CM

## 2012-07-09 DIAGNOSIS — N390 Urinary tract infection, site not specified: Secondary | ICD-10-CM

## 2012-07-09 DIAGNOSIS — Z7901 Long term (current) use of anticoagulants: Secondary | ICD-10-CM

## 2012-07-09 DIAGNOSIS — Z9010 Acquired absence of unspecified breast and nipple: Secondary | ICD-10-CM

## 2012-07-09 DIAGNOSIS — G459 Transient cerebral ischemic attack, unspecified: Secondary | ICD-10-CM

## 2012-07-09 DIAGNOSIS — M8000XA Age-related osteoporosis with current pathological fracture, unspecified site, initial encounter for fracture: Secondary | ICD-10-CM

## 2012-07-09 DIAGNOSIS — R8271 Bacteriuria: Secondary | ICD-10-CM

## 2012-07-09 DIAGNOSIS — IMO0002 Reserved for concepts with insufficient information to code with codable children: Secondary | ICD-10-CM

## 2012-07-09 DIAGNOSIS — C50919 Malignant neoplasm of unspecified site of unspecified female breast: Secondary | ICD-10-CM

## 2012-07-09 DIAGNOSIS — J4489 Other specified chronic obstructive pulmonary disease: Secondary | ICD-10-CM

## 2012-07-09 DIAGNOSIS — Z7982 Long term (current) use of aspirin: Secondary | ICD-10-CM

## 2012-07-09 DIAGNOSIS — R296 Repeated falls: Secondary | ICD-10-CM

## 2012-07-09 DIAGNOSIS — S72002A Fracture of unspecified part of neck of left femur, initial encounter for closed fracture: Secondary | ICD-10-CM

## 2012-07-09 DIAGNOSIS — Z720 Tobacco use: Secondary | ICD-10-CM

## 2012-07-09 DIAGNOSIS — S42302A Unspecified fracture of shaft of humerus, left arm, initial encounter for closed fracture: Secondary | ICD-10-CM

## 2012-07-09 DIAGNOSIS — M1610 Unilateral primary osteoarthritis, unspecified hip: Secondary | ICD-10-CM | POA: Diagnosis present

## 2012-07-09 DIAGNOSIS — B1910 Unspecified viral hepatitis B without hepatic coma: Secondary | ICD-10-CM

## 2012-07-09 HISTORY — DX: Unspecified dementia, unspecified severity, without behavioral disturbance, psychotic disturbance, mood disturbance, and anxiety: F03.90

## 2012-07-09 HISTORY — PX: FEMUR IM NAIL: SHX1597

## 2012-07-09 LAB — CBC WITH DIFFERENTIAL/PLATELET
Basophils Absolute: 0 10*3/uL (ref 0.0–0.1)
HCT: 37.1 % (ref 36.0–46.0)
Hemoglobin: 12.5 g/dL (ref 12.0–15.0)
Lymphocytes Relative: 9 % — ABNORMAL LOW (ref 12–46)
Monocytes Absolute: 1 10*3/uL (ref 0.1–1.0)
Monocytes Relative: 9 % (ref 3–12)
Neutro Abs: 9 10*3/uL — ABNORMAL HIGH (ref 1.7–7.7)
Neutrophils Relative %: 82 % — ABNORMAL HIGH (ref 43–77)
RDW: 13 % (ref 11.5–15.5)
WBC: 10.9 10*3/uL — ABNORMAL HIGH (ref 4.0–10.5)

## 2012-07-09 LAB — URINALYSIS, ROUTINE W REFLEX MICROSCOPIC
Bilirubin Urine: NEGATIVE
Glucose, UA: NEGATIVE mg/dL
Hgb urine dipstick: NEGATIVE
Ketones, ur: NEGATIVE mg/dL
Specific Gravity, Urine: 1.023 (ref 1.005–1.030)
pH: 5 (ref 5.0–8.0)

## 2012-07-09 LAB — PROTIME-INR: INR: 1.09 (ref 0.00–1.49)

## 2012-07-09 LAB — COMPREHENSIVE METABOLIC PANEL
AST: 25 U/L (ref 0–37)
Albumin: 3.8 g/dL (ref 3.5–5.2)
Alkaline Phosphatase: 76 U/L (ref 39–117)
CO2: 27 mEq/L (ref 19–32)
Chloride: 100 mEq/L (ref 96–112)
Creatinine, Ser: 0.6 mg/dL (ref 0.50–1.10)
GFR calc non Af Amer: 79 mL/min — ABNORMAL LOW (ref >90)
Potassium: 3.7 mEq/L (ref 3.5–5.1)
Total Bilirubin: 0.5 mg/dL (ref 0.3–1.2)

## 2012-07-09 SURGERY — INSERTION, INTRAMEDULLARY ROD, FEMUR
Anesthesia: General | Site: Hip | Laterality: Left | Wound class: Clean

## 2012-07-09 MED ORDER — ACETAMINOPHEN 10 MG/ML IV SOLN
INTRAVENOUS | Status: DC | PRN
  Administered 2012-07-09: 1000 mg via INTRAVENOUS

## 2012-07-09 MED ORDER — ACETAMINOPHEN 10 MG/ML IV SOLN
INTRAVENOUS | Status: AC
  Filled 2012-07-09: qty 100

## 2012-07-09 MED ORDER — PHENYLEPHRINE HCL 10 MG/ML IJ SOLN
INTRAMUSCULAR | Status: DC | PRN
  Administered 2012-07-09 (×2): 80 ug via INTRAVENOUS
  Administered 2012-07-09: 160 ug via INTRAVENOUS
  Administered 2012-07-09: 80 ug via INTRAVENOUS

## 2012-07-09 MED ORDER — CALCIUM CARBONATE-VITAMIN D 500-200 MG-UNIT PO TABS
1.0000 | ORAL_TABLET | Freq: Two times a day (BID) | ORAL | Status: DC
  Administered 2012-07-10 – 2012-07-11 (×3): 1 via ORAL
  Filled 2012-07-09 (×4): qty 1

## 2012-07-09 MED ORDER — CEFAZOLIN SODIUM-DEXTROSE 2-3 GM-% IV SOLR
2.0000 g | Freq: Four times a day (QID) | INTRAVENOUS | Status: AC
  Administered 2012-07-09 – 2012-07-10 (×2): 2 g via INTRAVENOUS
  Filled 2012-07-09 (×3): qty 50

## 2012-07-09 MED ORDER — PHENOL 1.4 % MT LIQD: 1.0000 | OROMUCOSAL | Status: DC | PRN

## 2012-07-09 MED ORDER — DROPERIDOL 2.5 MG/ML IJ SOLN: 0.6250 mg | INTRAMUSCULAR | Status: DC | PRN

## 2012-07-09 MED ORDER — LEVOTHYROXINE SODIUM 88 MCG PO TABS
88.0000 ug | ORAL_TABLET | Freq: Every day | ORAL | Status: DC
  Administered 2012-07-10 – 2012-07-11 (×2): 88 ug via ORAL
  Filled 2012-07-09 (×2): qty 1

## 2012-07-09 MED ORDER — METOCLOPRAMIDE HCL 5 MG/ML IJ SOLN: 5.0000 mg | Freq: Three times a day (TID) | INTRAMUSCULAR | Status: DC | PRN

## 2012-07-09 MED ORDER — METHOCARBAMOL 100 MG/ML IJ SOLN
500.0000 mg | Freq: Four times a day (QID) | INTRAVENOUS | Status: DC | PRN
  Filled 2012-07-09 (×2): qty 5

## 2012-07-09 MED ORDER — MENTHOL 3 MG MT LOZG: 1.0000 | LOZENGE | OROMUCOSAL | Status: DC | PRN

## 2012-07-09 MED ORDER — OXYCODONE HCL 5 MG/5ML PO SOLN: 5.0000 mg | Freq: Once | ORAL | Status: DC | PRN

## 2012-07-09 MED ORDER — METHOCARBAMOL 500 MG PO TABS
500.0000 mg | ORAL_TABLET | Freq: Four times a day (QID) | ORAL | Status: DC | PRN
  Administered 2012-07-10 (×3): 500 mg via ORAL
  Filled 2012-07-09 (×4): qty 1

## 2012-07-09 MED ORDER — LACTATED RINGERS IV SOLN
INTRAVENOUS | Status: DC | PRN
  Administered 2012-07-09: 17:00:00 via INTRAVENOUS

## 2012-07-09 MED ORDER — VITAMIN D3 25 MCG (1000 UNIT) PO TABS: 1000.0000 [IU] | ORAL_TABLET | Freq: Every day | ORAL | Status: DC

## 2012-07-09 MED ORDER — METOCLOPRAMIDE HCL 10 MG PO TABS: 5.0000 mg | ORAL_TABLET | Freq: Three times a day (TID) | ORAL | Status: DC | PRN

## 2012-07-09 MED ORDER — CALCITONIN (SALMON) 200 UNIT/ACT NA SOLN
1.0000 | Freq: Every day | NASAL | Status: DC
  Filled 2012-07-09: qty 3.7

## 2012-07-09 MED ORDER — ENOXAPARIN SODIUM 30 MG/0.3ML ~~LOC~~ SOLN
30.0000 mg | Freq: Two times a day (BID) | SUBCUTANEOUS | Status: DC
  Administered 2012-07-10 – 2012-07-11 (×3): 30 mg via SUBCUTANEOUS
  Filled 2012-07-09 (×4): qty 0.3

## 2012-07-09 MED ORDER — ALBUMIN HUMAN 5 % IV SOLN
INTRAVENOUS | Status: DC | PRN
  Administered 2012-07-09: 17:00:00 via INTRAVENOUS

## 2012-07-09 MED ORDER — CEFAZOLIN SODIUM-DEXTROSE 2-3 GM-% IV SOLR
2.0000 g | INTRAVENOUS | Status: AC
  Administered 2012-07-09: 2 g via INTRAVENOUS
  Filled 2012-07-09: qty 50

## 2012-07-09 MED ORDER — LACTATED RINGERS IV SOLN
INTRAVENOUS | Status: DC
  Administered 2012-07-09: 17:00:00 via INTRAVENOUS

## 2012-07-09 MED ORDER — CALCITONIN (SALMON) 200 UNIT/ACT NA SOLN: 1.0000 | Freq: Every day | NASAL | Status: DC

## 2012-07-09 MED ORDER — FENTANYL CITRATE 0.05 MG/ML IJ SOLN
INTRAMUSCULAR | Status: DC | PRN
  Administered 2012-07-09: 50 ug via INTRAVENOUS
  Administered 2012-07-09: 100 ug via INTRAVENOUS

## 2012-07-09 MED ORDER — SODIUM CHLORIDE 0.9 % IV SOLN
INTRAVENOUS | Status: AC
  Administered 2012-07-09: 15:00:00 via INTRAVENOUS

## 2012-07-09 MED ORDER — VITAMIN D3 25 MCG (1000 UNIT) PO TABS
1000.0000 [IU] | ORAL_TABLET | Freq: Every day | ORAL | Status: DC
Start: 2012-07-10 — End: 2012-07-11
  Administered 2012-07-10 – 2012-07-11 (×2): 1000 [IU] via ORAL
  Filled 2012-07-09 (×2): qty 1

## 2012-07-09 MED ORDER — CALCIUM CARBONATE-VITAMIN D 500-200 MG-UNIT PO TABS: 1.0000 | ORAL_TABLET | Freq: Two times a day (BID) | ORAL | Status: DC

## 2012-07-09 MED ORDER — ROCURONIUM BROMIDE 100 MG/10ML IV SOLN
INTRAVENOUS | Status: DC | PRN
  Administered 2012-07-09: 20 mg via INTRAVENOUS

## 2012-07-09 MED ORDER — ONDANSETRON HCL 4 MG/2ML IJ SOLN: 4.0000 mg | Freq: Four times a day (QID) | INTRAMUSCULAR | Status: DC | PRN

## 2012-07-09 MED ORDER — HYDROMORPHONE HCL PF 1 MG/ML IJ SOLN
0.2500 mg | INTRAMUSCULAR | Status: DC | PRN
  Administered 2012-07-09 (×2): 0.5 mg via INTRAVENOUS

## 2012-07-09 MED ORDER — ONDANSETRON HCL 4 MG PO TABS: 4.0000 mg | ORAL_TABLET | Freq: Four times a day (QID) | ORAL | Status: DC | PRN

## 2012-07-09 MED ORDER — ONDANSETRON HCL 4 MG/2ML IJ SOLN
INTRAMUSCULAR | Status: DC | PRN
  Administered 2012-07-09: 4 mg via INTRAVENOUS

## 2012-07-09 MED ORDER — ONDANSETRON HCL 4 MG/2ML IJ SOLN
4.0000 mg | Freq: Three times a day (TID) | INTRAMUSCULAR | Status: AC | PRN
  Administered 2012-07-09: 4 mg via INTRAVENOUS
  Filled 2012-07-09: qty 2

## 2012-07-09 MED ORDER — LEVOTHYROXINE SODIUM 88 MCG PO TABS: 88.0000 ug | ORAL_TABLET | Freq: Every day | ORAL | Status: DC

## 2012-07-09 MED ORDER — ACETAMINOPHEN 325 MG PO TABS
650.0000 mg | ORAL_TABLET | Freq: Four times a day (QID) | ORAL | Status: DC | PRN
  Filled 2012-07-09: qty 2

## 2012-07-09 MED ORDER — GLYCOPYRROLATE 0.2 MG/ML IJ SOLN
INTRAMUSCULAR | Status: DC | PRN
  Administered 2012-07-09: 0.4 mg via INTRAVENOUS

## 2012-07-09 MED ORDER — OXYCODONE HCL 5 MG PO TABS: 5.0000 mg | ORAL_TABLET | Freq: Once | ORAL | Status: DC | PRN

## 2012-07-09 MED ORDER — MORPHINE SULFATE 2 MG/ML IJ SOLN
0.5000 mg | INTRAMUSCULAR | Status: DC | PRN
  Administered 2012-07-11: 0.5 mg via INTRAVENOUS
  Filled 2012-07-09: qty 1

## 2012-07-09 MED ORDER — HYDROCODONE-ACETAMINOPHEN 5-325 MG PO TABS
1.0000 | ORAL_TABLET | Freq: Four times a day (QID) | ORAL | Status: DC | PRN
  Administered 2012-07-11: 2 via ORAL
  Filled 2012-07-09: qty 1
  Filled 2012-07-09: qty 2

## 2012-07-09 MED ORDER — PROPOFOL 10 MG/ML IV BOLUS
INTRAVENOUS | Status: DC | PRN
  Administered 2012-07-09: 70 mg via INTRAVENOUS

## 2012-07-09 MED ORDER — NEOSTIGMINE METHYLSULFATE 1 MG/ML IJ SOLN
INTRAMUSCULAR | Status: DC | PRN
  Administered 2012-07-09: 2 mg via INTRAVENOUS

## 2012-07-09 MED ORDER — ENOXAPARIN SODIUM 30 MG/0.3ML ~~LOC~~ SOLN: 30.0000 mg | Freq: Two times a day (BID) | SUBCUTANEOUS | Status: DC

## 2012-07-09 MED ORDER — ACETAMINOPHEN 650 MG RE SUPP: 650.0000 mg | Freq: Four times a day (QID) | RECTAL | Status: DC | PRN

## 2012-07-09 MED ORDER — SODIUM CHLORIDE 0.9 % IV SOLN
10.0000 mg | INTRAVENOUS | Status: DC | PRN
  Administered 2012-07-09: 100 ug/min via INTRAVENOUS

## 2012-07-09 MED ORDER — SUCCINYLCHOLINE CHLORIDE 20 MG/ML IJ SOLN
INTRAMUSCULAR | Status: DC | PRN
  Administered 2012-07-09: 100 mg via INTRAVENOUS

## 2012-07-09 MED ORDER — HYDROMORPHONE HCL PF 1 MG/ML IJ SOLN
0.5000 mg | INTRAMUSCULAR | Status: DC | PRN
  Administered 2012-07-09: 0.5 mg via INTRAVENOUS
  Filled 2012-07-09: qty 1

## 2012-07-09 MED ORDER — BISACODYL 10 MG RE SUPP: 10.0000 mg | Freq: Every day | RECTAL | Status: DC | PRN

## 2012-07-09 SURGICAL SUPPLY — 48 items
BANDAGE ELASTIC 4 VELCRO ST LF (GAUZE/BANDAGES/DRESSINGS) ×2 IMPLANT
BANDAGE ELASTIC 6 VELCRO ST LF (GAUZE/BANDAGES/DRESSINGS) ×2 IMPLANT
BIT DRILL CANN LG 4.3MM (BIT) ×1 IMPLANT
CLOTH BEACON ORANGE TIMEOUT ST (SAFETY) ×2 IMPLANT
COVER SURGICAL LIGHT HANDLE (MISCELLANEOUS) ×2 IMPLANT
DRAPE C-ARM 42X72 X-RAY (DRAPES) ×2 IMPLANT
DRAPE INCISE IOBAN 66X45 STRL (DRAPES) IMPLANT
DRAPE ORTHO SPLIT 77X108 STRL (DRAPES) ×2
DRAPE PROXIMA HALF (DRAPES) ×4 IMPLANT
DRAPE STERI IOBAN 125X83 (DRAPES) IMPLANT
DRAPE SURG ORHT 6 SPLT 77X108 (DRAPES) ×2 IMPLANT
DRAPE U-SHAPE 47X51 STRL (DRAPES) ×2 IMPLANT
DRILL BIT CANN LG 4.3MM (BIT) ×2
DRSG ADAPTIC 3X8 NADH LF (GAUZE/BANDAGES/DRESSINGS) ×2 IMPLANT
DRSG MEPILEX BORDER 4X4 (GAUZE/BANDAGES/DRESSINGS) ×2 IMPLANT
DRSG MEPILEX BORDER 4X8 (GAUZE/BANDAGES/DRESSINGS) ×2 IMPLANT
DURAPREP 26ML APPLICATOR (WOUND CARE) ×2 IMPLANT
ELECT REM PT RETURN 9FT ADLT (ELECTROSURGICAL) ×2
ELECTRODE REM PT RTRN 9FT ADLT (ELECTROSURGICAL) ×1 IMPLANT
GLOVE BIOGEL PI ORTHO PRO 7.5 (GLOVE) ×1
GLOVE BIOGEL PI ORTHO PRO SZ8 (GLOVE) ×1
GLOVE ORTHO TXT STRL SZ7.5 (GLOVE) ×2 IMPLANT
GLOVE PI ORTHO PRO STRL 7.5 (GLOVE) ×1 IMPLANT
GLOVE PI ORTHO PRO STRL SZ8 (GLOVE) ×1 IMPLANT
GLOVE SURG ORTHO 8.5 STRL (GLOVE) ×2 IMPLANT
GOWN STRL NON-REIN LRG LVL3 (GOWN DISPOSABLE) ×6 IMPLANT
GOWN STRL REIN XL XLG (GOWN DISPOSABLE) ×4 IMPLANT
GUIDEPIN 2.2X44.5 THRD TIP (PIN) ×2 IMPLANT
GUIDEWIRE BALL NOSE 80CM (WIRE) ×2 IMPLANT
KIT BASIN OR (CUSTOM PROCEDURE TRAY) ×2 IMPLANT
KIT ROOM TURNOVER OR (KITS) ×2 IMPLANT
MANIFOLD NEPTUNE II (INSTRUMENTS) ×2 IMPLANT
NAIL HIP FRACT 130D 11X180 (Screw) ×2 IMPLANT
NS IRRIG 1000ML POUR BTL (IV SOLUTION) ×2 IMPLANT
PACK GENERAL/GYN (CUSTOM PROCEDURE TRAY) ×2 IMPLANT
PAD ARMBOARD 7.5X6 YLW CONV (MISCELLANEOUS) ×4 IMPLANT
PAD CAST 4YDX4 CTTN HI CHSV (CAST SUPPLIES) ×1 IMPLANT
PADDING CAST COTTON 4X4 STRL (CAST SUPPLIES) ×1
SCREW ANTI ROTATION 80MM (Screw) ×2 IMPLANT
SCREW BONE CORTICAL 5.0X3 (Screw) ×2 IMPLANT
SCREW DRILL BIT ANIT ROTATION (BIT) ×2 IMPLANT
SCREW LAG HIP NAIL 10.5X95 (Screw) ×2 IMPLANT
STAPLER VISISTAT 35W (STAPLE) IMPLANT
SUT ETHILON 4 0 PS 2 18 (SUTURE) ×2 IMPLANT
SUT VIC AB 0 CTB1 27 (SUTURE) IMPLANT
SUT VIC AB 2-0 CT1 27 (SUTURE)
SUT VIC AB 2-0 CT1 TAPERPNT 27 (SUTURE) IMPLANT
WATER STERILE IRR 1000ML POUR (IV SOLUTION) ×4 IMPLANT

## 2012-07-09 NOTE — Progress Notes (Signed)
Disposition Note  Emily Bautista, is a 78 y.o. female,   MRN: [id number]  -  DOB - 07/03/1922  Outpatient Primary MD for the patient is ALTHEIMER,MICHAEL D, MD   Blood pressure 117/66, pulse 91, temperature 97.4 F (36.3 C), temperature source Oral, resp. rate 21, height 5\' 5"  (1.651 m), weight 47.628 kg (105 lb), SpO2 98.00%.  Active Problems:  Hip fracture, left  Falls frequently  Hypothyroid  Hepatitis B infection  Breast cancer  Hyperlipemia    78 yo female was discovered to have left hip pain this am.  Xray reveals an intertrochanteric hip fracture on the left.  Dr. Ranell Patrick, orthopedic surgeon has been consulted and will likely operate this afternoon between 4 - 5.  Labs, vitals and EKG are stable.  Head CT (for completeness) is pending.  I have requested a med surg bed.  Algis Downs, PA-C [facility] Pager: [phone]

## 2012-07-09 NOTE — Progress Notes (Signed)
Pt picking at dressing and nasal cannula then falls back to sleep

## 2012-07-09 NOTE — ED Notes (Signed)
Per pt family at bedside she reportedly fell around 10 pm last night in the dining room at her care facility.

## 2012-07-09 NOTE — H&P (Signed)
[facility] History and Physical  Emily Bautista ZOX:[id number] DOB: 03/14/1922 DOA: 07/09/2012   PCP: Junious Silk, MD [facility]   Chief Complaint:  Chief Complaint  Patient presents with  . Hip Injury     HPI: Emily Bautista is a 78 y.o. female with dementia, SNF resident was brought today to the Ed after she fell and was found to have a left hip fracture. She also had low sats in the ED . She is unable to provide any history due to dementia. According to family that patient can transfer with assistance holding her weight.    Review of Systems: according to family she never had heart ds   Past Medical History  Diagnosis Date  . Hyperlipemia   . Thyroid disease   . Breast cancer   . Compression fracture of lumbar vertebra   . Hepatitis B infection   . TIA (transient ischemic attack)   . Hypothyroidism   . Dementia    Past Surgical History  Procedure Date  . Mastectomy   . Wrist surgery   . Appendectomy    Social History:  reports that she has quit smoking. She has quit using smokeless tobacco. She reports that she does not drink alcohol or use illicit drugs. Lives in SNF . Totally dependent on ADLs  No Known Allergies  Fhx Non contributory due to advanced age , not to mention unobtainable from patient    Prior to Admission medications   Medication Sig Start Date End Date Taking? Authorizing Provider  aspirin 325 MG tablet Take 325 mg by mouth daily.    Yes Historical Provider, MD  calcitonin, salmon, (MIACALCIN/FORTICAL) 200 UNIT/ACT nasal spray Place 1 spray into the nose daily. 09/02/11 09/01/12 Yes Rhetta Mura, MD  calcium-vitamin D (OSCAL WITH D) 500-200 MG-UNIT per tablet Take 1 tablet by mouth 2 (two) times daily.   Yes Historical Provider, MD  cholecalciferol (VITAMIN D) 1000 UNITS tablet Take 1,000 Units by mouth daily.   Yes Historical Provider, MD  ezetimibe-simvastatin (VYTORIN) 10-40 MG per tablet Take 1 tablet by mouth at  bedtime.    Yes Historical Provider, MD  HYDROcodone-acetaminophen (NORCO/VICODIN) 5-325 MG per tablet Take 1 tablet by mouth every 3 (three) hours as needed. For pain   Yes Historical Provider, MD  levothyroxine (SYNTHROID, LEVOTHROID) 88 MCG tablet Take 88 mcg by mouth daily.    Yes Historical Provider, MD  LORazepam (ATIVAN) 0.5 MG tablet Take 0.25 mg by mouth every 4 (four) hours as needed. For anxiety   Yes Historical Provider, MD  polyethylene glycol (MIRALAX / GLYCOLAX) packet Take 17 g by mouth daily as needed. For constipation   Yes Historical Provider, MD  risperiDONE (RISPERDAL) 0.5 MG tablet Take 0.5 mg by mouth 2 (two) times daily.   Yes Historical Provider, MD  senna-docusate (SENOKOT-S) 8.6-50 MG per tablet Take 1 tablet by mouth daily.   Yes Historical Provider, MD  traMADol (ULTRAM) 50 MG tablet Take 100 mg by mouth every 6 (six) hours as needed. For pain   Yes Historical Provider, MD   Physical Exam: Filed Vitals:   07/09/12 1152 07/09/12 1200 07/09/12 1309 07/09/12 1400  BP: 106/84 117/73 117/68 99/60  Pulse: 94 117 100   Temp: 98.9 F (37.2 C)  97.7 F (36.5 C)   TempSrc: Oral  Oral   Resp: 18 19 20 12   Height:      Weight:      SpO2: 97% 81% 95%  General:  Arousable, hard of hearing, not oriented x3  Eyes: pupils 1 mm  Symmetric reactive to light  ENT: dry oral mucosa  Neck: no JVD  Cardiovascular: tachycardic, regular, no murmurs  Respiratory: CTAB, hypopnea, bradypnea,   Abdomen: soft, NT , mild distension, BS diminished   Skin: pale, dry  Musculoskeletal: tender over left hip  Psychiatric: unobtainable  Neurologic: able to move UE bilat without problems.  Labs on Admission:  Basic Metabolic Panel:  Lab 07/09/12 4782  NA 137  K 3.7  CL 100  CO2 27  GLUCOSE 104*  BUN 25*  CREATININE 0.60  CALCIUM 10.2  MG --  PHOS --   Liver Function Tests:  Lab 07/09/12 1006  AST 25  ALT 23  ALKPHOS 76  BILITOT 0.5  PROT 7.5  ALBUMIN 3.8    No results found for this basename: LIPASE:5,AMYLASE:5 in the last 168 hours No results found for this basename: AMMONIA:5 in the last 168 hours CBC:  Lab 07/09/12 1006  WBC 10.9*  NEUTROABS 9.0*  HGB 12.5  HCT 37.1  MCV 90.5  PLT 177   Cardiac Enzymes: No results found for this basename: CKTOTAL:5,CKMB:5,CKMBINDEX:5,TROPONINI:5 in the last 168 hours  BNP (last 3 results) No results found for this basename: PROBNP:3 in the last 8760 hours CBG: No results found for this basename: GLUCAP:5 in the last 168 hours  Radiological Exams on Admission: Dg Chest 1 View  07/09/2012  *RADIOLOGY REPORT*  Clinical Data: Preoperative respiratory evaluation prior to ORIF of the left femoral neck fracture.  CHEST - 1 VIEW  Comparison: Two-view chest x-ray 08/29/2011, 06/08/2010.  Findings: Cardiac silhouette upper normal in size for the AP technique.  Thoracic aorta tortuous and atherosclerotic, unchanged. Hilar and mediastinal contours otherwise unremarkable.  Lungs hyperinflated but clear.  Prior right mastectomy and axillary node dissection.  Osteopenia.  IMPRESSION: Hyperinflation consistent with COPD and/or asthma.  No acute cardiopulmonary disease.   Original Report Authenticated By: Arnell Sieving, M.D.    Dg Hip Complete Left  07/09/2012  *RADIOLOGY REPORT*  Clinical Data: Left hip pain.  LEFT HIP - COMPLETE 2+ VIEW  Comparison: Left hip x-rays 11/25/2011.  Findings: Comminuted intertrochanteric left femoral neck fracture. Hip joint intact with moderate joint space narrowing and hypertrophic spurring involving the femoral head.  Osteopenia.  Included AP pelvis demonstrates symmetric moderate joint space narrowing in the contralateral right hip.  No fractures elsewhere. Sacroiliac joints intact with degenerative changes.  Symphysis pubis intact.  Iliofemoral atherosclerosis.  IMPRESSION: Comminuted intertrochanteric left femoral neck fracture.  Moderate osteoarthritis involving both hips.    Original Report Authenticated By: Arnell Sieving, M.D.    Ct Head Wo Contrast  07/09/2012  *RADIOLOGY REPORT*  Clinical Data: Larey Seat last night.  CT HEAD WITHOUT CONTRAST  Technique:  Contiguous axial images were obtained from the base of the skull through the vertex without contrast.  Comparison: 11/26/2011 MR.  11/25/2011 CT.  Findings: Motion degraded exam.  No skull fracture or intracranial hemorrhage.  Global atrophy without hydrocephalus.  Anterior left frontal extra-axial cystic septated structure unchanged which may represent an arachnoid cyst.  Small vessel disease type changes.  No CT evidence of large acute thrombotic infarct.  Vascular calcifications.  IMPRESSION: Motion degraded exam.  No skull fracture or intracranial hemorrhage.  Global atrophy without hydrocephalus.  Anterior left frontal extra-axial cystic septated structure unchanged which may represent an arachnoid cyst.  Small vessel disease type changes.   Original Report Authenticated By: Fuller Canada,  M.D.     EKG: Independently reviewed. NSR, no Q waves  Assessment/Plan Principal Problem:  *Hip fracture, left Active Problems:  Hypothyroid  Osteoporosis with pathological fracture  Tobacco abuse  Hyperlipidemia  Closed left humeral fracture  Falls frequently  Hypothyroidism  Hepatitis B infection  Breast cancer  Hyperlipemia  Dementia  COPD (chronic obstructive pulmonary disease)  UTI (lower urinary tract infection)  Constipation   1. Left hip fracture - patient may benefit from ORIF to decrease pain and preserve mobility. There are no red flags (e.g ACS, decomp CHF,  ARF, severe Aortic stenosis, hemodynamic instability ) so OK to proceed to OR tonight. 2. Dementia 3. Probable UTI - will get abx with surgery  4. COPD - IS, oxygen supplements 5. Hypothroidism - c/w synthroid 6. Constipation - will need enema after surgery   Code Status: daughter wants her full code Family Communication: Bautista,Emily  Daughter [phone] Disposition Plan: snf  Time spent: 1 hour  Zoiey Christy [facility] Pager [phone]  If 7PM-7AM, please contact night-coverage [email or web address] Password [email or web address] 07/09/2012, 2:51 PM

## 2012-07-09 NOTE — ED Notes (Signed)
Family at bedside. 

## 2012-07-09 NOTE — Transfer of Care (Signed)
Immediate Anesthesia Transfer of Care Note  Patient: Emily Bautista  Procedure(s) Performed: Procedure(s) (LRB) with comments: INTRAMEDULLARY (IM) NAIL FEMORAL (Left)  Patient Location: PACU  Anesthesia Type: General  Level of Consciousness: awake, oriented and patient cooperative  Airway & Oxygen Therapy: Patient Spontanous Breathing and Patient connected to nasal cannula oxygen  Post-op Assessment: Report given to PACU RN and Post -op Vital signs reviewed and stable  Post vital signs: Reviewed and stable  Complications: No apparent anesthesia complications

## 2012-07-09 NOTE — Anesthesia Postprocedure Evaluation (Signed)
Anesthesia Post Note  Patient: Emily Bautista  Procedure(s) Performed: Procedure(s) (LRB): INTRAMEDULLARY (IM) NAIL FEMORAL (Left)  Anesthesia type: General  Patient location: PACU  Post pain: Pain level controlled and Adequate analgesia  Post assessment: Post-op Vital signs reviewed, Patient's Cardiovascular Status Stable, Respiratory Function Stable, Patent Airway and Pain level controlled  Last Vitals:  Filed Vitals:   07/09/12 1620  BP: 100/60  Pulse:   Temp:   Resp: 12    Post vital signs: Reviewed and stable  Level of consciousness: awake, alert  and oriented  Complications: No apparent anesthesia complications

## 2012-07-09 NOTE — Addendum Note (Signed)
Addendum  created 07/09/12 1914 by Tatanisha Cuthbert Todd Elyana Grabski, CRNA   Modules edited:Anesthesia Medication Administration    

## 2012-07-09 NOTE — ED Notes (Signed)
Went in to do ECG and try to get a urine sample.  Patient is still out of room.

## 2012-07-09 NOTE — Anesthesia Preprocedure Evaluation (Signed)
Anesthesia Evaluation    Airway       Dental   Pulmonary COPD         Cardiovascular     Neuro/Psych PSYCHIATRIC DISORDERS DementiaTIA   GI/Hepatic negative GI ROS, (+) Hepatitis -, B  Endo/Other  Hypothyroidism   Renal/GU      Musculoskeletal   Abdominal   Peds  Hematology   Anesthesia Other Findings   Reproductive/Obstetrics                           Anesthesia Physical Anesthesia Plan  ASA: III  Anesthesia Plan: General   Post-op Pain Management:    Induction: Intravenous  Airway Management Planned: Oral ETT  Additional Equipment:   Intra-op Plan:   Post-operative Plan: Possible Post-op intubation/ventilation  Informed Consent: I have reviewed the patients History and Physical, chart, labs and discussed the procedure including the risks, benefits and alternatives for the proposed anesthesia with the patient or authorized representative who has indicated his/her understanding and acceptance.   Dental advisory given  Plan Discussed with: CRNA, Anesthesiologist and Surgeon  Anesthesia Plan Comments:         Anesthesia Quick Evaluation

## 2012-07-09 NOTE — ED Provider Notes (Signed)
History     CSN: [id number]  Arrival date & time 07/09/12  7829   First MD Initiated Contact with Patient 07/09/12 [phone]      Chief Complaint  Patient presents with  . Hip Injury    (Consider location/radiation/quality/duration/timing/severity/associated sxs/prior treatment) HPI Comments: Patient presents from nursing home with reported diagnosis of left hip fracture. Patient denies falling or any injury. She has no pain complaints. She denies any headache, chest pain, shortness of breath, back pain, nausea or vomiting. She's had good by mouth intake and urine output.  The history is provided by the patient and the EMS personnel.    Past Medical History  Diagnosis Date  . Hyperlipemia   . Thyroid disease   . Breast cancer   . Compression fracture of lumbar vertebra   . Hepatitis B infection   . TIA (transient ischemic attack)   . Hypothyroidism   . Dementia     Past Surgical History  Procedure Date  . Mastectomy   . Wrist surgery   . Appendectomy     No family history on file.  History  Substance Use Topics  . Smoking status: Former Smoker -- 0.0 packs/day for 60 years  . Smokeless tobacco: Former Neurosurgeon   Comment: current uses smokless cigarettes  . Alcohol Use: No    OB History    Grav Para Term Preterm Abortions TAB SAB Ect Mult Living                  Review of Systems  Constitutional: Negative for fever, activity change and appetite change.  HENT: Negative for congestion and rhinorrhea.   Eyes: Negative for visual disturbance.  Respiratory: Negative for cough, chest tightness and shortness of breath.   Cardiovascular: Negative for chest pain.  Gastrointestinal: Negative for nausea, vomiting and abdominal pain.  Genitourinary: Negative for dysuria and hematuria.  Musculoskeletal: Positive for myalgias and arthralgias. Negative for back pain.  Neurological: Negative for dizziness, weakness and headaches.    Allergies  Review of patient's allergies  indicates no known allergies.  Home Medications   Current Outpatient Rx  Name Route Sig Dispense Refill  . ASPIRIN 325 MG PO TABS Oral Take 325 mg by mouth daily.     Marland Kitchen CALCITONIN (SALMON) 200 UNIT/ACT NA SOLN Nasal Place 1 spray into the nose daily. 3.7 mL 0    Use for 7 days  . CALCIUM CARBONATE-VITAMIN D 500-200 MG-UNIT PO TABS Oral Take 1 tablet by mouth 2 (two) times daily.    Marland Kitchen VITAMIN D 1000 UNITS PO TABS Oral Take 1,000 Units by mouth daily.    Marland Kitchen EZETIMIBE-SIMVASTATIN 10-40 MG PO TABS Oral Take 1 tablet by mouth at bedtime.     Marland Kitchen HYDROCODONE-ACETAMINOPHEN 5-325 MG PO TABS Oral Take 1 tablet by mouth every 3 (three) hours as needed. For pain    . LEVOTHYROXINE SODIUM 88 MCG PO TABS Oral Take 88 mcg by mouth daily.     Marland Kitchen LORAZEPAM 0.5 MG PO TABS Oral Take 0.25 mg by mouth every 4 (four) hours as needed. For anxiety    . POLYETHYLENE GLYCOL 3350 PO PACK Oral Take 17 g by mouth daily as needed. For constipation    . RISPERIDONE 0.5 MG PO TABS Oral Take 0.5 mg by mouth 2 (two) times daily.    Bernadette Hoit SODIUM 8.6-50 MG PO TABS Oral Take 1 tablet by mouth daily.    . TRAMADOL HCL 50 MG PO TABS Oral Take 100 mg by  mouth every 6 (six) hours as needed. For pain      BP 99/60  Pulse 100  Temp 97.7 F (36.5 C) (Oral)  Resp 12  Ht 5\' 5"  (1.651 m)  Wt 105 lb (47.628 kg)  BMI 17.47 kg/m2  SpO2 95%  Physical Exam  Constitutional: She is oriented to person, place, and time. She appears well-developed and well-nourished. No distress.  HENT:  Head: Normocephalic and atraumatic.  Mouth/Throat: Oropharynx is clear and moist. No oropharyngeal exudate.  Eyes: Conjunctivae normal and EOM are normal. Pupils are equal, round, and reactive to light.  Neck: Normal range of motion. Neck supple.       No C-spine pain, step-off or deformity  Cardiovascular: Normal rate, regular rhythm and normal heart sounds.   No murmur heard. Pulmonary/Chest: Effort normal and breath sounds normal.  No respiratory distress.  Abdominal: Soft. There is no tenderness. There is no rebound and no guarding.  Musculoskeletal: Normal range of motion. She exhibits tenderness. She exhibits no edema.       There is tenderness palpation the left hip only. There is mild external rotation. +2 DP and PT pulse No tenderness to palpation of T. or L spine  Neurological: She is alert and oriented to person, place, and time. No cranial nerve deficit.       5 out of 5 strength throughout, cranial nerves 3-12 intact, no ataxia finger to nose  Skin: Skin is warm.    ED Course  Procedures (including critical care time)  Labs Reviewed  CBC WITH DIFFERENTIAL - Abnormal; Notable for the following:    WBC 10.9 (*)     Neutrophils Relative 82 (*)     Neutro Abs 9.0 (*)     Lymphocytes Relative 9 (*)     All other components within normal limits  COMPREHENSIVE METABOLIC PANEL - Abnormal; Notable for the following:    Glucose, Bld 104 (*)     BUN 25 (*)     GFR calc non Af Amer 79 (*)     All other components within normal limits  URINALYSIS, ROUTINE W REFLEX MICROSCOPIC - Abnormal; Notable for the following:    Color, Urine AMBER (*)  BIOCHEMICALS MAY BE AFFECTED BY COLOR   APPearance CLOUDY (*)     All other components within normal limits  PROTIME-INR   Dg Chest 1 View  07/09/2012  *RADIOLOGY REPORT*  Clinical Data: Preoperative respiratory evaluation prior to ORIF of the left femoral neck fracture.  CHEST - 1 VIEW  Comparison: Two-view chest x-ray 08/29/2011, 06/08/2010.  Findings: Cardiac silhouette upper normal in size for the AP technique.  Thoracic aorta tortuous and atherosclerotic, unchanged. Hilar and mediastinal contours otherwise unremarkable.  Lungs hyperinflated but clear.  Prior right mastectomy and axillary node dissection.  Osteopenia.  IMPRESSION: Hyperinflation consistent with COPD and/or asthma.  No acute cardiopulmonary disease.   Original Report Authenticated By: Arnell Sieving, M.D.     Dg Hip Complete Left  07/09/2012  *RADIOLOGY REPORT*  Clinical Data: Left hip pain.  LEFT HIP - COMPLETE 2+ VIEW  Comparison: Left hip x-rays 11/25/2011.  Findings: Comminuted intertrochanteric left femoral neck fracture. Hip joint intact with moderate joint space narrowing and hypertrophic spurring involving the femoral head.  Osteopenia.  Included AP pelvis demonstrates symmetric moderate joint space narrowing in the contralateral right hip.  No fractures elsewhere. Sacroiliac joints intact with degenerative changes.  Symphysis pubis intact.  Iliofemoral atherosclerosis.  IMPRESSION: Comminuted intertrochanteric left femoral neck fracture.  Moderate osteoarthritis involving both hips.   Original Report Authenticated By: Arnell Sieving, M.D.    Ct Head Wo Contrast  07/09/2012  *RADIOLOGY REPORT*  Clinical Data: Larey Seat last night.  CT HEAD WITHOUT CONTRAST  Technique:  Contiguous axial images were obtained from the base of the skull through the vertex without contrast.  Comparison: 11/26/2011 MR.  11/25/2011 CT.  Findings: Motion degraded exam.  No skull fracture or intracranial hemorrhage.  Global atrophy without hydrocephalus.  Anterior left frontal extra-axial cystic septated structure unchanged which may represent an arachnoid cyst.  Small vessel disease type changes.  No CT evidence of large acute thrombotic infarct.  Vascular calcifications.  IMPRESSION: Motion degraded exam.  No skull fracture or intracranial hemorrhage.  Global atrophy without hydrocephalus.  Anterior left frontal extra-axial cystic septated structure unchanged which may represent an arachnoid cyst.  Small vessel disease type changes.   Original Report Authenticated By: Fuller Canada, M.D.      1. Intertrochanteric fracture of left hip   2. COPD (chronic obstructive pulmonary disease)   3. Dementia   4. Hyperlipemia       MDM  Incidental finding of left hip fracture without reported trauma. Vitals stable no  distress. No focal neurological deficits. Neurovascular intact and distal left lower leg.  Family arrives and states that patient did phone the dining room day or 2 ago. Is a history dementia and is at her baseline.  D/w PA Dixon for Dr. Ranell Patrick.  Plan OR aroud 1600-1700. Admission d/w hospitalists.   Date: 07/09/2012  Rate: 90  Rhythm: normal sinus rhythm  QRS Axis: normal  Intervals: normal  ST/T Wave abnormalities: normal  Conduction Disutrbances:none  Narrative Interpretation:   Old EKG Reviewed: unchanged    Glynn Octave, MD 07/09/12 1512

## 2012-07-09 NOTE — Consult Note (Signed)
CC: left hip pain HPI: per report pt fell today injuring left hip. Pt is disoriented due to dementia but is alert and awake Denies any other injury s/p this fall PMH: hypothyroid, hyperlipidemia, hepatitis B, TIA Social: lives in [place], walks with moderate help, previous smoker, non drinker Allergies: NKDA Meds: see chart ROS: hx of dementia with disorientation and hx of frequent falls, left hip pain currently with inability to ambulate,  PE: alert 78 y/o female disoriented to time and place Cervical spine shows full rom  Bilateral upper extremities: full rom, no deformity, nv intact distally Pelvis: mild tenderness to left pelvis Left lower extremity: mild shortening and external rotation left hip, nv intact distally Right lower extremity with good rom without pain, nv intact distally X-rays: left femur fracture Assessment: Left femur fracture Plan: Recommend surgical management as long as family consents for surgery  Keep npo Plan for surgery later today upon medical clearance Discussed plan with grandson in the room Pain control as needed Patient is a limited household ambulator. Family is in agreement that surgical intervention is warranted. Plan IM Nail.  Verlee Rossetti, MD [facility]

## 2012-07-09 NOTE — ED Notes (Signed)
Pt here from [facility] with reported Left hip fx.  Pt had mobile x-ray completed yesterday with report showing left acute intertrochanteric fracture.  Pt denies falling or any injury.

## 2012-07-09 NOTE — Preoperative (Signed)
Beta Blockers   Reason not to administer Beta Blockers:Not Applicable 

## 2012-07-09 NOTE — Progress Notes (Signed)
Pt oriented to person but does not know where she is when reminded she fell remembers for a short while

## 2012-07-09 NOTE — Interval H&P Note (Signed)
History and Physical Interval Note:  07/09/2012 4:50 PM  Emily Bautista  has presented today for surgery, with the diagnosis of left femur fracture (intertrochanteric hip fx).  The various methods of treatment have been discussed with the patient and family. After consideration of risks, benefits and other options for treatment, the patient has consented to  Procedure(s) (LRB) with comments: INTRAMEDULLARY (IM) NAIL FEMORAL (Left) as a surgical intervention .  The patient's history has been reviewed, patient examined, no change in status, stable for surgery.  I have reviewed the patient's chart and labs.  Questions were answered to the patient's satisfaction.     Sawyer Mentzer,STEVEN R

## 2012-07-09 NOTE — ED Notes (Signed)
To or 

## 2012-07-09 NOTE — Addendum Note (Signed)
Addendum  created 07/09/12 1914 by Hessie Dibble, CRNA   Modules edited:Anesthesia Medication Administration

## 2012-07-09 NOTE — Brief Op Note (Signed)
07/09/2012  6:56 PM  PATIENT:  Cassandria F Gupton  78 y.o. female  PRE-OPERATIVE DIAGNOSIS:  left femur fracture, intertrochanteric  POST-OPERATIVE DIAGNOSIS:  left femur fracture, intertrochanteric  PROCEDURE:  Procedure(s) (LRB) with comments: INTRAMEDULLARY (IM) NAIL FEMORAL (Left), Zerita Boers Affixus SURGEON:  Surgeon(s) and Role:    * Verlee Rossetti, MD - Primary  PHYSICIAN ASSISTANT:   ASSISTANTS: Thea Gist, Pa-C   ANESTHESIA:   general  EBL:  Total I/O In: 850 [I.V.:600; IV Piggyback:250] Out: 300 [Urine:300]  BLOOD ADMINISTERED:none  DRAINS: none   LOCAL MEDICATIONS USED:  NONE  SPECIMEN:  No Specimen  DISPOSITION OF SPECIMEN:  N/A  COUNTS:  YES  TOURNIQUET:  * No tourniquets in log *  DICTATION: .Other Dictation: Dictation Number [id number]  PLAN OF CARE: Admit to inpatient   PATIENT DISPOSITION:  PACU - hemodynamically stable.   Delay start of Pharmacological VTE agent (>24hrs) due to surgical blood loss or risk of bleeding: not applicable

## 2012-07-09 NOTE — Progress Notes (Signed)
Portable x-ray done

## 2012-07-10 ENCOUNTER — Encounter (HOSPITAL_COMMUNITY): Payer: Self-pay

## 2012-07-10 DIAGNOSIS — M810 Age-related osteoporosis without current pathological fracture: Secondary | ICD-10-CM

## 2012-07-10 LAB — CBC
MCV: 90.8 fL (ref 78.0–100.0)
Platelets: 162 10*3/uL (ref 150–400)
RBC: 3.38 MIL/uL — ABNORMAL LOW (ref 3.87–5.11)
RDW: 13.3 % (ref 11.5–15.5)
WBC: 11.4 10*3/uL — ABNORMAL HIGH (ref 4.0–10.5)

## 2012-07-10 LAB — BASIC METABOLIC PANEL
CO2: 24 mEq/L (ref 19–32)
Chloride: 103 mEq/L (ref 96–112)
GFR calc Af Amer: >90 mL/min (ref >90)
Potassium: 4.6 mEq/L (ref 3.5–5.1)
Sodium: 137 mEq/L (ref 135–145)

## 2012-07-10 LAB — MRSA PCR SCREENING: MRSA by PCR: NEGATIVE

## 2012-07-10 MED ORDER — POLYSACCHARIDE IRON COMPLEX 150 MG PO CAPS
150.0000 mg | ORAL_CAPSULE | Freq: Every day | ORAL | Status: DC
  Administered 2012-07-11: 150 mg via ORAL
  Filled 2012-07-10 (×2): qty 1

## 2012-07-10 MED ORDER — POLYETHYLENE GLYCOL 3350 17 G PO PACK
17.0000 g | PACK | Freq: Every day | ORAL | Status: DC
  Administered 2012-07-11: 17 g via ORAL
  Filled 2012-07-10: qty 1

## 2012-07-10 NOTE — Op Note (Signed)
NAMEAREMY, SHAVERS NO.:  [id number]  MEDICAL RECORD NO.:  [id number]  LOCATION:  5N12C                        FACILITY:  [facility]  PHYSICIAN:  Almedia Balls. Ranell Patrick, M.D. DATE OF BIRTH:  1921-11-17  DATE OF PROCEDURE:  07/09/2012 DATE OF DISCHARGE:                              OPERATIVE REPORT   PREOPERATIVE DIAGNOSIS:  Left femur fracture, displaced intertrochanteric.  POSTOPERATIVE DIAGNOSIS:  Left femur fracture, displaced intertrochanteric.  PROCEDURE PERFORMED:  Left femur intramedullary nailing and DePuy Affixus nail system.  ATTENDING SURGEON:  Almedia Balls. Ranell Patrick, M.D.  ASSISTANT:  Donnie Coffin. Dixon, PA-C, who scrubbed the entire surgery and necessary for completion of procedure.  ANESTHESIA:  General anesthesia was used.  ESTIMATED BLOOD LOSS:  Less than 200 mL.  FLUID REPLACEMENT:  1000 mL crystalloid.  INSTRUMENT COUNTS:  Correct.  COMPLICATIONS:  No complications.  Perioperative antibiotics were given.  INDICATIONS:  The patient is an 78 year old female, status post ground level fall, injuring her left hip.  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 She was transported to [facility] ER where x-rays were obtained demonstrating displaced intertrochanteric hip fracture.  Clinically, the patient has shortened externally rotated left lower extremity and I discussed with the patient's family and the patient desired surgical management for the hip to realign the fracture and to stabilize it to allow for adequate nursing care and potential mobilization.  Informed consent was obtained.  DESCRIPTION OF PROCEDURE:  After adequate level of anesthesia achieved, the patient was positioned in the supine position.  She was placed on the fracture table.  Perineal post utilized.  Right leg placed in a modified lithotomy position.  Pulse was checked post positioning.  The left leg was placed in traction  boot and traction and internal rotation was applied.  X-ray was brought in.  C-arm was utilized yielding anatomic reduction both on AP and lateral planes.  We then sterilely prepped and draped the left hip and down on the leg in the usual manner. I placed a shower curtain on.  The time-out was called.  We then made a longitudinal skin incision proximal to the greater trochanter of the hip on the left side.  Dissection down through subcutaneous tissues using the Bovie.  We identified the tensor fascia lata and divided along the line with the skin incision.  We identified the greater trochanter and introduced a guide pin across the greater trochanter down across the fracture site.  Once the position of the guide pin was localized in 2 views and verified, we went ahead and placed our step-cut drill and opened up the proximal femur.  We  then attempted to introduce the 11 mm x 130 degree short trochanteric nail with an Affixus nail by DePuy across the fracture site.  The nail seemed to bottom out just short of where we needed to be.  Thus we went ahead and reamed, starting at size 10 and 11 and up to a 12.5 reaming to accommodate the 11 nail and the 11 nail passed easily into the proper position.  Once appropriate depth of the nail, then we placed a guide  pin up into the femoral head.  For the lag screw, we then drilled that out over the guide pin and then drilled a second antirotation screw just as this looked like maybe a basicervical as much as a intertrochanteric and once we had that second guide pin in for the antirotation screw, then we went ahead and drilled for the lag screw and then placed the real lag screw, 95 mm lag screw into position.  We had put the set screw in for the lag screw to stabilize that and then put the second antirotation screw which was 80 mm lag screw up in the appropriate position.  Once those 2 screws were in place, we verified the position, AP and lateral views.   We next went ahead and used the jig to drill holes for our interlocking screw distally which was a statically lock screw.  We drilled that with the drill bit and then introduced a 4.5 titanium interlocking screw, verified in 2 planes, removed our jig, thoroughly irrigated all wounds and closed with layered closure with Vicryl suture and staples.  Sterile compressive bandage was applied.  The patient tolerated the surgery well.     Almedia Balls. Ranell Patrick, M.D.     SRN/MEDQ  D:  07/09/2012  T:  07/10/2012  Job:  [id number]

## 2012-07-10 NOTE — Evaluation (Signed)
Physical Therapy Evaluation Patient Details Name: Emily Bautista MRN: [id number] DOB: [birth year] Today's Date: 07/10/2012 Time: 1025-1050 PT Time Calculation (min): 25 min  PT Assessment / Plan / Recommendation Clinical Impression  Pt is 78 y/o female admitted for s/p fall with left hip IM nail.  Per Family pt has multiple falls and overall limited.  Pt will benefit from acute PT services to improve overall mobility and prepare for safe d/c to next venue.    PT Assessment  Patient needs continued PT services    Follow Up Recommendations  Skilled nursing facility    Barriers to Discharge None none    Equipment Recommendations  None recommended by PT    Recommendations for Other Services  (none)   Frequency Min 3X/week    Precautions / Restrictions Precautions Precautions: Fall Precaution Comments: Family stated pt has had multiple falls and usually occurs when she tries to get up on her own. Restrictions Weight Bearing Restrictions: Yes RLE Weight Bearing: Weight bearing as tolerated LLE Weight Bearing: Weight bearing as tolerated   Pertinent Vitals/Pain Pt unable to rate however facial grimace with any mobility.       Mobility  Bed Mobility Bed Mobility: Supine to Sit Supine to Sit: 1: +2 Total assist;With rails;HOB elevated Supine to Sit: Patient Percentage: 40% Details for Bed Mobility Assistance: +2 (A) for safety with max cues for technique.  Pt resisting movement due to pain. Transfers Transfers: Sit to Stand;Stand to Sit Sit to Stand: 1: +2 Total assist;From bed Sit to Stand: Patient Percentage: 40% Stand to Sit: 1: +2 Total assist;To chair/3-in-1 Stand to Sit: Patient Percentage: 40% Stand Pivot Transfers: 1: +2 Total assist Stand Pivot Transfers: Patient Percentage: 30% Details for Transfer Assistance: +2 (A) to maintain balance and advance hips to recliner.  Pt unable to stand completely upright during transfer. Ambulation/Gait Ambulation/Gait Assistance:  Not tested (comment)    Exercises     PT Diagnosis: Difficulty walking;Abnormality of gait;Generalized weakness;Acute pain  PT Problem List: Decreased strength;Decreased range of motion;Decreased activity tolerance;Decreased balance;Decreased mobility;Decreased knowledge of use of DME;Pain PT Treatment Interventions: DME instruction;Gait training;Functional mobility training;Therapeutic activities;Therapeutic exercise;Balance training;Patient/family education   PT Goals Acute Rehab PT Goals PT Goal Formulation: With patient/family Time For Goal Achievement: 07/24/12 Potential to Achieve Goals: Fair Pt will go Supine/Side to Sit: with min assist PT Goal: Supine/Side to Sit - Progress: Goal set today Pt will Sit at Edge of Bed: with supervision;3-5 min PT Goal: Sit at Edge Of Bed - Progress: Goal set today Pt will go Sit to Supine/Side: with min assist PT Goal: Sit to Supine/Side - Progress: Goal set today Pt will go Sit to Stand: with min assist PT Goal: Sit to Stand - Progress: Goal set today Pt will go Stand to Sit: with min assist PT Goal: Stand to Sit - Progress: Goal set today Pt will Transfer Bed to Chair/Chair to Bed: with mod assist PT Transfer Goal: Bed to Chair/Chair to Bed - Progress: Goal set today Pt will Stand: with min assist;3 - 5 min PT Goal: Stand - Progress: Goal set today Pt will Ambulate: 1 - 15 feet;with +2 total assist;with rolling walker PT Goal: Ambulate - Progress: Goal set today  Visit Information  Last PT Received On: 07/10/12 Assistance Needed: +2    Subjective Data  Subjective: "Is this coffee? I like it stronger." Patient Stated Goal: Family goal to return to [facility]   Prior Functioning  Home Living Lives With: Other (Comment) (Pt lives  at [facility]) Available Help at Discharge: Skilled Nursing Facility Type of Home: Skilled Nursing Facility Prior Function Level of Independence: Needs assistance Needs Assistance:  Bathing;Dressing;Feeding;Grooming;Toileting;Meal Prep;Gait;Transfers Bath: Maximal Dressing: Maximal Feeding: Supervision/set-up Grooming: Moderate Toileting: Maximal Meal Prep: Unable to assess Gait Assistance: Total (A) +2 - only ambulates small steps to w/c <> bed, w/c<>chiar Driving: No Vocation: Retired Musician: No difficulties Dominant Hand: Right    Cognition  Overall Cognitive Status: Appears within functional limits for tasks assessed/performed Arousal/Alertness: Awake/alert Orientation Level: Disoriented to;Time;Situation Behavior During Session: WFL for tasks performed    Extremity/Trunk Assessment Right Lower Extremity Assessment RLE ROM/Strength/Tone: Unable to fully assess;Due to impaired cognition Left Lower Extremity Assessment LLE ROM/Strength/Tone: Unable to fully assess;Due to pain;Due to impaired cognition   Balance Balance Balance Assessed: Yes Static Sitting Balance Static Sitting - Balance Support: Feet supported Static Sitting - Level of Assistance: 2: Max assist;4: Min assist Static Sitting - Comment/# of Minutes: Initial max (A) due to posterior lean from pain but able to improve sitting balance to min (A) for  ~5 minutes  End of Session PT - End of Session Equipment Utilized During Treatment: Gait belt Activity Tolerance: Patient limited by pain Patient left: in chair;with call bell/phone within reach;with family/visitor present Nurse Communication: Mobility status;Precautions  GP     Gavon Majano 07/10/2012, 11:39 AM Jake Shark, PT DPT [phone]

## 2012-07-10 NOTE — Progress Notes (Addendum)
Orthopedics Progress Note  Subjective: Pt resting comfortably in no acute distress  Objective:  Filed Vitals:   07/10/12 0607  BP: 99/50  Pulse: 89  Temp: 97.7 F (36.5 C)  Resp: 18    General: Awake and alert  Musculoskeletal: left hip incision healing well with no drainage or erythema Neurovascularly intact  Lab Results  Component Value Date   WBC 10.9* 07/09/2012   HGB 12.5 07/09/2012   HCT 37.1 07/09/2012   MCV 90.5 07/09/2012   PLT 177 07/09/2012       Component Value Date/Time   NA 137 07/09/2012 1006   K 3.7 07/09/2012 1006   CL 100 07/09/2012 1006   CO2 27 07/09/2012 1006   GLUCOSE 104* 07/09/2012 1006   BUN 25* 07/09/2012 1006   CREATININE 0.60 07/09/2012 1006   CALCIUM 10.2 07/09/2012 1006   GFRNONAA 79* 07/09/2012 1006   GFRAA >90 07/09/2012 1006    Lab Results  Component Value Date   INR 1.09 07/09/2012   INR 0.99 06/09/2010    Assessment/Plan: POD #1 s/p Procedure(s):right INTRAMEDULLARY (IM) NAIL FEMORAL  PT/OT as able, pt may bear partial weight on the left side with aid of walker and help Pt will need to return to SNF once cleared from medical standpoint and suspect that will be in 2-3 days, ? Monday discharge Pain control as needed  Almedia Balls. Ranell Patrick, MD 07/10/2012 7:25 AM    patient stable this afternoon. Possible d/c to [facility] tomorrow if she has a good night. D/c foley Safety Sitter needed due to advanced dementia, thanks  Verlee Rossetti, MD

## 2012-07-10 NOTE — Progress Notes (Signed)
UR COMPLETED  

## 2012-07-10 NOTE — Progress Notes (Signed)
Orthopedic Tech Progress Note Patient Details:  Emily Bautista January 22, 1922 [id number]   I put on trapez  [name] 07/10/2012, 7:39 AM

## 2012-07-10 NOTE — Progress Notes (Signed)
[facility] PROGRESS NOTE  GUNEET DELPINO AVW:[id number] DOB: 11/03/1921 DOA: 07/09/2012 PCP: Junious Silk, MD  Assessment/Plan: Principal Problem:  *Hip fracture, left Active Problems:  Hypothyroid  Osteoporosis with pathological fracture  Tobacco abuse  Hyperlipidemia  Closed left humeral fracture  Falls frequently  Hypothyroidism  Hepatitis B infection  Breast cancer  Hyperlipemia  Dementia  COPD (chronic obstructive pulmonary disease)  UTI (lower urinary tract infection)  Constipation  1. Left hip fracture- s/p orif 07/09/12 - doing amazingly well postop  2. ? UTI - culture not sent before abx - would probably continue abx for 1 week at snf 3. COPD - would leave on oxygen at DC - should improve ability to rehab  4. Ongoing tobacco abuse 5. Severe osteoporosis - definitely made worse by No4. C/w calcitonin spray calcium and vitamin D 6. HL 7. Dementia - at baseline  8. Hx of multiple fractures  9. Mild postop acute blood loss anemia - start iron 9/12 Constipation - Miralax,and suppository  Code Status: full Family Communication: daughter Disposition Plan: back to snf   Brief narrative: 78 yo woamn admitted with hip fracture   Consultants:  Beverely Low  Procedures:  Left femur ORIF 9/11 - intramedullary nailing and DePuy Affixus Nail system   Antibiotics:  Cefazolin 9/11  HPI/Subjective: Doing great - minimal pain   Objective: Filed Vitals:   07/09/12 2318 07/10/12 0216 07/10/12 0400 07/10/12 0607  BP: 115/75 130/63  99/50  Pulse: 88 92  89  Temp: 98 F (36.7 C) 97.7 F (36.5 C)  97.7 F (36.5 C)  TempSrc: Oral     Resp: 16 16 18 18   Height:      Weight:      SpO2: 98% 100% 97% 93%    Intake/Output Summary (Last 24 hours) at 07/10/12 1142 Last data filed at 07/10/12 0700  Gross per 24 hour  Intake    850 ml  Output    600 ml  Net    250 ml   Filed Weights   07/09/12 0934  Weight: 47.628 kg (105 lb)     Exam:   General:  Alert and oriented to daughter   Cardiovascular: rrr  Respiratory: ctab   Abdomen: soft, nt   Data Reviewed: Basic Metabolic Panel:  Lab 07/10/12 8295 07/09/12 1006  NA 137 137  K 4.6 3.7  CL 103 100  CO2 24 27  GLUCOSE 108* 104*  BUN 17 25*  CREATININE 0.46* 0.60  CALCIUM 9.5 10.2  MG -- --  PHOS -- --   Liver Function Tests:  Lab 07/09/12 1006  AST 25  ALT 23  ALKPHOS 76  BILITOT 0.5  PROT 7.5  ALBUMIN 3.8   No results found for this basename: LIPASE:5,AMYLASE:5 in the last 168 hours No results found for this basename: AMMONIA:5 in the last 168 hours CBC:  Lab 07/10/12 0607 07/09/12 1006  WBC 11.4* 10.9*  NEUTROABS -- 9.0*  HGB 10.2* 12.5  HCT 30.7* 37.1  MCV 90.8 90.5  PLT 162 177   Cardiac Enzymes: No results found for this basename: CKTOTAL:5,CKMB:5,CKMBINDEX:5,TROPONINI:5 in the last 168 hours BNP (last 3 results) No results found for this basename: PROBNP:3 in the last 8760 hours CBG: No results found for this basename: GLUCAP:5 in the last 168 hours  Recent Results (from the past 240 hour(s))  MRSA PCR SCREENING     Status: Normal   Collection Time   07/10/12  4:49 AM      Component  Value Range Status Comment   MRSA by PCR NEGATIVE  NEGATIVE Final      Studies: Dg Chest 1 View  07/09/2012  *RADIOLOGY REPORT*  Clinical Data: Preoperative respiratory evaluation prior to ORIF of the left femoral neck fracture.  CHEST - 1 VIEW  Comparison: Two-view chest x-ray 08/29/2011, 06/08/2010.  Findings: Cardiac silhouette upper normal in size for the AP technique.  Thoracic aorta tortuous and atherosclerotic, unchanged. Hilar and mediastinal contours otherwise unremarkable.  Lungs hyperinflated but clear.  Prior right mastectomy and axillary node dissection.  Osteopenia.  IMPRESSION: Hyperinflation consistent with COPD and/or asthma.  No acute cardiopulmonary disease.   Original Report Authenticated By: Arnell Sieving, M.D.     Dg Hip Complete Left  07/09/2012  *RADIOLOGY REPORT*  Clinical Data: Left hip pain.  LEFT HIP - COMPLETE 2+ VIEW  Comparison: Left hip x-rays 11/25/2011.  Findings: Comminuted intertrochanteric left femoral neck fracture. Hip joint intact with moderate joint space narrowing and hypertrophic spurring involving the femoral head.  Osteopenia.  Included AP pelvis demonstrates symmetric moderate joint space narrowing in the contralateral right hip.  No fractures elsewhere. Sacroiliac joints intact with degenerative changes.  Symphysis pubis intact.  Iliofemoral atherosclerosis.  IMPRESSION: Comminuted intertrochanteric left femoral neck fracture.  Moderate osteoarthritis involving both hips.   Original Report Authenticated By: Arnell Sieving, M.D.    Dg Hip Operative Left  07/09/2012  *RADIOLOGY REPORT*  Clinical Data: Left hip fracture, ORIF.  OPERATIVE LEFT HIP  Comparison: 07/09/2012  Findings: Multiple intraoperative spot images demonstrate internal fixation across the left intertrochanteric fracture.  Near anatomic alignment.  No visible hardware complicating feature.  IMPRESSION: Internal fixation left intertrochanteric fracture.   Original Report Authenticated By: Cyndie Chime, M.D.    Ct Head Wo Contrast  07/09/2012  *RADIOLOGY REPORT*  Clinical Data: Larey Seat last night.  CT HEAD WITHOUT CONTRAST  Technique:  Contiguous axial images were obtained from the base of the skull through the vertex without contrast.  Comparison: 11/26/2011 MR.  11/25/2011 CT.  Findings: Motion degraded exam.  No skull fracture or intracranial hemorrhage.  Global atrophy without hydrocephalus.  Anterior left frontal extra-axial cystic septated structure unchanged which may represent an arachnoid cyst.  Small vessel disease type changes.  No CT evidence of large acute thrombotic infarct.  Vascular calcifications.  IMPRESSION: Motion degraded exam.  No skull fracture or intracranial hemorrhage.  Global atrophy without  hydrocephalus.  Anterior left frontal extra-axial cystic septated structure unchanged which may represent an arachnoid cyst.  Small vessel disease type changes.   Original Report Authenticated By: Fuller Canada, M.D.    Dg Pelvis Portable  07/09/2012  *RADIOLOGY REPORT*  Clinical Data: Postop, left hip  PORTABLE PELVIS  Comparison: Preoperative radiographs obtained earlier today, 07/09/2012 at 10:40 a.m.  Findings: Interval surgical changes of ORIF of the left intertrochanteric fracture with an intramedullary rod, single distal interlocking screw, and two proximal femoral neck gamma nails. A line is improved compared to the preoperative radiographs. No immediate hardware complication identified.  Scattered atherosclerotic vascular calcifications.  IMPRESSION: ORIF of left intertrochanteric fracture as above without evidence of immediate hardware complication.   Original Report Authenticated By: Vilma Prader     Scheduled Meds:    . sodium chloride   Intravenous STAT  . calcitonin (salmon)  1 spray Alternating Nares Daily  . calcium-vitamin D  1 tablet Oral BID  .  ceFAZolin (ANCEF) IV  2 g Intravenous 60 min Pre-Op  .  ceFAZolin (ANCEF) IV  2 g Intravenous Q6H  . cholecalciferol  1,000 Units Oral Daily  . enoxaparin (LOVENOX) injection  30 mg Subcutaneous Q12H  . iron polysaccharides  150 mg Oral Daily  . levothyroxine  88 mcg Oral Daily  . DISCONTD: calcitonin (salmon)  1 spray Alternating Nares Daily  . DISCONTD: calcium-vitamin D  1 tablet Oral BID  . DISCONTD: cholecalciferol  1,000 Units Oral Daily  . DISCONTD: enoxaparin (LOVENOX) injection  30 mg Subcutaneous Q12H  . DISCONTD: levothyroxine  88 mcg Oral Daily   Continuous Infusions:    . DISCONTD: lactated ringers      Principal Problem:  *Hip fracture, left Active Problems:  Hypothyroid  Osteoporosis with pathological fracture  Tobacco abuse  Hyperlipidemia  Closed left humeral fracture  Falls frequently  Hypothyroidism   Hepatitis B infection  Breast cancer  Hyperlipemia  Dementia  COPD (chronic obstructive pulmonary disease)  UTI (lower urinary tract infection)  Constipation    Time spent: 30 minutes    Romir Klimowicz  [facility] Pager [phone]. If 8PM-8AM, please contact night-coverage at [email or web address], password [facility] 07/10/2012, 11:42 AM  LOS: 1 day

## 2012-07-10 NOTE — Progress Notes (Signed)
INITIAL ADULT NUTRITION ASSESSMENT Date: 07/10/2012   Time: 11:48 AM Reason for Assessment: poor PO  ASSESSMENT: Female 78 y.o.  Dx: Hip fracture, left  Hx:  Past Medical History  Diagnosis Date  . Hyperlipemia   . Thyroid disease   . Breast cancer   . Compression fracture of lumbar vertebra   . Hepatitis B infection   . TIA (transient ischemic attack)   . Hypothyroidism   . Dementia    Past Surgical History  Procedure Date  . Wrist surgery   . Appendectomy   . Mastectomy     right sided    Related Meds:  Scheduled Meds:   . sodium chloride   Intravenous STAT  . calcitonin (salmon)  1 spray Alternating Nares Daily  . calcium-vitamin D  1 tablet Oral BID  .  ceFAZolin (ANCEF) IV  2 g Intravenous 60 min Pre-Op  .  ceFAZolin (ANCEF) IV  2 g Intravenous Q6H  . cholecalciferol  1,000 Units Oral Daily  . enoxaparin (LOVENOX) injection  30 mg Subcutaneous Q12H  . iron polysaccharides  150 mg Oral Daily  . levothyroxine  88 mcg Oral Daily  . polyethylene glycol  17 g Oral Daily  . DISCONTD: calcitonin (salmon)  1 spray Alternating Nares Daily  . DISCONTD: calcium-vitamin D  1 tablet Oral BID  . DISCONTD: cholecalciferol  1,000 Units Oral Daily  . DISCONTD: enoxaparin (LOVENOX) injection  30 mg Subcutaneous Q12H  . DISCONTD: levothyroxine  88 mcg Oral Daily   Continuous Infusions:   . DISCONTD: lactated ringers     PRN Meds:.acetaminophen, acetaminophen, bisacodyl, HYDROcodone-acetaminophen, menthol-cetylpyridinium, methocarbamol (ROBAXIN) IV, methocarbamol, metoCLOPramide (REGLAN) injection, metoCLOPramide, morphine injection, ondansetron (ZOFRAN) IV, ondansetron (ZOFRAN) IV, ondansetron, phenol, DISCONTD: droperidol, DISCONTD:  HYDROmorphone (DILAUDID) injection, DISCONTD:  HYDROmorphone (DILAUDID) injection, DISCONTD: oxyCODONE, DISCONTD: oxyCODONE   Ht: 5\' 5"  (165.1 cm)  Wt: 105 lb (47.628 kg)  Ideal Wt: 56.8 kg % Ideal Wt: 83%  Usual Wt: 43-49 kg since  October 2012 % Usual Wt: 100%  Body mass index is 17.47 kg/(m^2).  Food/Nutrition Related Hx: Mighty shakes TID at SNF per [facility]  Labs:  CMP     Component Value Date/Time   NA 137 07/10/2012 0607   K 4.6 07/10/2012 0607   CL 103 07/10/2012 0607   CO2 24 07/10/2012 0607   GLUCOSE 108* 07/10/2012 0607   BUN 17 07/10/2012 0607   CREATININE 0.46* 07/10/2012 0607   CALCIUM 9.5 07/10/2012 0607   PROT 7.5 07/09/2012 1006   ALBUMIN 3.8 07/09/2012 1006   AST 25 07/09/2012 1006   ALT 23 07/09/2012 1006   ALKPHOS 76 07/09/2012 1006   BILITOT 0.5 07/09/2012 1006   GFRNONAA 86* 07/10/2012 0607   GFRAA >90 07/10/2012 1610    Intake: no intake documented yet, pt s/p recent surgery and diet advancement Output:   Intake/Output Summary (Last 24 hours) at 07/10/12 1151 Last data filed at 07/10/12 0700  Gross per 24 hour  Intake    850 ml  Output    600 ml  Net    250 ml   No BM since admission  Diet Order: Regular  Supplements/Tube Feeding: none at this time1  IVF:    DISCONTD: lactated ringers    Estimated Nutritional Needs:   Kcal: 1340-1520  Protein: 57-66g Fluid: ~1.5 L/day  Pt admitted s/p hip fx at nursing facility.  Pt with advanced dementia- oriented only to person and meeting with physician at this time.  Review of available  medical records shows pt on Regular diet with Mighty shakes daily.   Wt hx shows pt with stable wt for the past 9 months, 2% wt loss over the past 1 yr.    Unable to dx with malnutrition at this time, although some level suspected.  Pt is underweight.  RD will continue to follow  NUTRITION DIAGNOSIS: -Increased nutrient needs (NI-5.1).  Status: Ongoing  RELATED TO: healing, wt management  AS EVIDENCE BY: pt with hip fx, subtoptimal BMI for age and build  MONITORING/EVALUATION(Goals): 1.  Food/Beverage; diet advancement  EDUCATION NEEDS: -Education needs addressed  INTERVENTION: 1.  Supplements; Mighty shakes TID.  Magic cup with lunch.  Dietitian  #: [phone]  DOCUMENTATION CODES Per approved criteria  -Underweight    Loyce Dys Sue-Ellen 07/10/2012, 11:48 AM

## 2012-07-11 DIAGNOSIS — K5900 Constipation, unspecified: Secondary | ICD-10-CM

## 2012-07-11 DIAGNOSIS — D62 Acute posthemorrhagic anemia: Secondary | ICD-10-CM

## 2012-07-11 LAB — BASIC METABOLIC PANEL
BUN: 17 mg/dL (ref 6–23)
Chloride: 103 mEq/L (ref 96–112)
GFR calc Af Amer: >90 mL/min (ref >90)
GFR calc non Af Amer: 79 mL/min — ABNORMAL LOW (ref >90)
Glucose, Bld: 92 mg/dL (ref 70–99)
Potassium: 4 mEq/L (ref 3.5–5.1)
Sodium: 137 mEq/L (ref 135–145)

## 2012-07-11 LAB — CBC
HCT: 27.7 % — ABNORMAL LOW (ref 36.0–46.0)
Hemoglobin: 9.4 g/dL — ABNORMAL LOW (ref 12.0–15.0)
WBC: 8.4 10*3/uL (ref 4.0–10.5)

## 2012-07-11 MED ORDER — HYDROCODONE-ACETAMINOPHEN 5-325 MG PO TABS
1.0000 | ORAL_TABLET | Freq: Three times a day (TID) | ORAL | Status: DC | PRN
Start: 2012-07-11 — End: 2016-11-19

## 2012-07-11 MED ORDER — ACETAMINOPHEN 500 MG PO TABS: 500.0000 mg | ORAL_TABLET | Freq: Four times a day (QID) | ORAL | Status: AC

## 2012-07-11 MED ORDER — LORAZEPAM 0.5 MG PO TABS: 0.2500 mg | ORAL_TABLET | Freq: Three times a day (TID) | ORAL | Status: DC | PRN

## 2012-07-11 MED ORDER — METHOCARBAMOL 500 MG PO TABS: 500.0000 mg | ORAL_TABLET | Freq: Four times a day (QID) | ORAL | Status: AC | PRN

## 2012-07-11 MED ORDER — POLYSACCHARIDE IRON COMPLEX 150 MG PO CAPS: 150.0000 mg | ORAL_CAPSULE | Freq: Two times a day (BID) | ORAL | Status: DC

## 2012-07-11 MED ORDER — ENOXAPARIN SODIUM 30 MG/0.3ML ~~LOC~~ SOLN: 30.0000 mg | Freq: Every day | SUBCUTANEOUS | Status: DC

## 2012-07-11 NOTE — Progress Notes (Signed)
CARE MANAGEMENT NOTE 07/11/2012  Patient:  Emily Bautista, Emily Bautista   Account Number:  [id number]  Date Initiated:  07/11/2012  Documentation initiated by:  Vance Peper  Subjective/Objective Assessment:   78 yr old female s/p left femur I & D.     Action/Plan:   patient is from [facility], will return there. Social Worker is aware.   Anticipated DC Date:  07/12/2012   Anticipated DC Plan:  SKILLED NURSING FACILITY  In-house referral  Clinical Social Worker      DC Planning Services  CM consult      Choice offered to / List presented to:             Status of service:  Completed, signed off Medicare Important Message given?   (If response is "NO", the following Medicare IM given date fields will be blank) Date Medicare IM given:   Date Additional Medicare IM given:    Discharge Disposition:  SKILLED NURSING FACILITY  Per UR Regulation:    If discussed at Long Length of Stay Meetings, dates discussed:    Comments:

## 2012-07-11 NOTE — Progress Notes (Signed)
Physical Therapy Treatment Patient Details Name: Emily Bautista MRN: [id number] DOB: December 11, 1921 Today's Date: 07/11/2012 Time: 4098-1191 PT Time Calculation (min): 26 min  PT Assessment / Plan / Recommendation Comments on Treatment Session  Pt was cooperative with treatment and perform exercises in the chair and did two sit to stands.  The second sit to stand the pt used more upper body assist to lift her self up.  Pt leaned her LE against the chair to help stabilize herself.  While in standing pt and PT practiced weight shift from on foot to the other.    Follow Up Recommendations  Skilled nursing facility    Barriers to Discharge        Equipment Recommendations  None recommended by PT    Recommendations for Other Services Other (comment) (None)  Frequency Min 3X/week   Plan Discharge plan remains appropriate;Frequency remains appropriate    Precautions / Restrictions Precautions Precautions: Fall Precaution Comments: Family stated pt has had multiple falls and usually occurs when she tries to get up on her own. Restrictions Weight Bearing Restrictions: Yes RLE Weight Bearing: Weight bearing as tolerated LLE Weight Bearing: Weight bearing as tolerated   Pertinent Vitals/Pain Pt experienced pain in her L LE.    Mobility  Bed Mobility Supine to Sit: 1: +2 Total assist;HOB elevated Supine to Sit: Patient Percentage: 40% Details for Bed Mobility Assistance: use of pad for to rotate LE and hips to EOB, pt with pain during this despite LE support Transfers Sit to Stand: 1: +1 Total assist;With upper extremity assist;From chair/3-in-1 Sit to Stand: Patient Percentage: 50% Stand to Sit: 1: +1 Total assist;With upper extremity assist;To chair/3-in-1 Stand to Sit: Patient Percentage: 50% Details for Transfer Assistance: VC for hand placement on therapist (sit to stand) and on chair (stand to sit). Pt was able to achieve upright standing    Exercises General Exercises - Lower  Extremity Ankle Circles/Pumps: AROM;10 reps;Both Quad Sets: Left;5 reps;AAROM Short Arc Quad: AAROM;Left;10 reps Heel Slides: AAROM;Left;10 reps Hip ABduction/ADduction: AAROM;Left;10 reps   PT Diagnosis:    PT Problem List:   PT Treatment Interventions:     PT Goals Acute Rehab PT Goals PT Goal Formulation: With patient/family Time For Goal Achievement: 07/24/12 Potential to Achieve Goals: Fair Pt will go Sit to Stand: with min assist PT Goal: Sit to Stand - Progress: Progressing toward goal Pt will Stand: with min assist;3 - 5 min PT Goal: Stand - Progress: Progressing toward goal  Visit Information  Last PT Received On: 07/11/12 Assistance Needed: +2 PT/OT Co-Evaluation/Treatment: Yes (Observation and then began treatment at end of OT session)    Subjective Data  Subjective: What are we doing today? Patient Stated Goal: Family goal to return [facility]   Cognition  Overall Cognitive Status: History of cognitive impairments - at baseline Arousal/Alertness: Lethargic Orientation Level: Disoriented to;Time;Situation;Place Behavior During Session: Theme park manager Sitting - Balance Support: Feet supported Static Sitting - Level of Assistance: 4: Min assist Static Sitting - Comment/# of Minutes: sitting on 3n1 for ~36min  End of Session PT - End of Session Equipment Utilized During Treatment: Gait belt Activity Tolerance: Patient limited by pain;Patient limited by fatigue Patient left: in chair;with call bell/phone within reach;with nursing in room Nurse Communication: Mobility status   GP     Loanne Emery 07/11/2012, 1:26 PM

## 2012-07-11 NOTE — Progress Notes (Signed)
Agree with PT treatment note.  Navi Erber, PT DPT [phone]  

## 2012-07-11 NOTE — Progress Notes (Signed)
Orthopedics Progress Note  Subjective: Pt awake and alert with mild pain to left hip today Plan for snf possibly today  Objective:  Filed Vitals:   07/11/12 0610  BP: 96/53  Pulse: 96  Temp: 98.6 F (37 C)  Resp: 18    General: Awake and alert  Musculoskeletal: left hip incision healing well, no erythema, no drainage, nv intact distally Neurovascularly intact  Lab Results  Component Value Date   WBC 8.4 07/11/2012   HGB 9.4* 07/11/2012   HCT 27.7* 07/11/2012   MCV 91.4 07/11/2012   PLT 140* 07/11/2012       Component Value Date/Time   NA 137 07/11/2012 0605   K 4.0 07/11/2012 0605   CL 103 07/11/2012 0605   CO2 28 07/11/2012 0605   GLUCOSE 92 07/11/2012 0605   BUN 17 07/11/2012 0605   CREATININE 0.59 07/11/2012 0605   CALCIUM 9.5 07/11/2012 0605   GFRNONAA 79* 07/11/2012 0605   GFRAA >90 07/11/2012 0605    Lab Results  Component Value Date   INR 1.09 07/09/2012   INR 0.99 06/09/2010    Assessment/Plan: POD #2 s/p Procedure(s):left INTRAMEDULLARY (IM) NAIL FEMORAL  Left hip is healing well May bear partial weight on the left leg with therapy and a walker F/u with Dr. Ranell Patrick in 2 weeks Plan discussed with family  Almedia Balls. Ranell Patrick, MD 07/11/2012 1:06 PM

## 2012-07-11 NOTE — Clinical Social Work Psychosocial (Addendum)
    Clinical Social Work Department BRIEF PSYCHOSOCIAL ASSESSMENT 07/11/2012  Patient:  Emily Bautista, Emily Bautista     Account Number:  [id number]     Admit date:  07/09/2012  Clinical Social Worker:  Tiburcio Pea  Date/Time:  07/10/2012 06:00 PM  Referred by:  Physician  Date Referred:  07/10/2012 Referred for  SNF Placement   Other Referral:   Interview type:  Other - See comment Other interview type:   Message left for pt's son. Patient is alert but has dementia    PSYCHOSOCIAL DATA Living Status:  FACILITY Admitted from facility:  [facility] Level of care:  Skilled Nursing Facility Primary support name:  Tiina Debow Primary support relationship to patient:  CHILD, ADULT Degree of support available:   [place] support    CURRENT CONCERNS Current Concerns  Post-Acute Placement   Other Concerns:    SOCIAL WORK ASSESSMENT / PLAN Resident of [facility]. Per MD- plan return to SNF tomorrow. SPoke to [facility]- Admissions at SNF- wlil accept patient back when mediclaly stable. FL2 placed on chart for MD's signature.  Patient is alert but has dementia- she is unable to participate in d/c plan.  Attempting to reach her son Darcel Bayley.   Assessment/plan status:  Psychosocial Support/Ongoing Assessment of Needs Other assessment/ plan:   Information/referral to community resources:   None- dc back to SNF when stable.    PATIENT'S/FAMILY'S RESPONSE TO PLAN OF CARE: Pateint is alert but confused. Patient nodded "yes" when told about return to SNF tomorrow. Message left for pt's son Darcel Bayley.  Plan d/c back to SNF tomorrow.

## 2012-07-11 NOTE — Progress Notes (Signed)
OK per MD for d/c back to SNF today. Bed is available at [facility] today per [facility]- Admissions Director of [facility].  EMS to transport. Notified patient's son Darcel Bayley of d/c and he is pleased. Pt's nurse- Annice Pih notified of above. No further CSW needs identified.  Lorri Frederick. [place]  [phone]

## 2012-07-11 NOTE — Discharge Summary (Signed)
Physician Discharge Summary  Emily Bautista ZOX:[id number] DOB: 1922/07/13 DOA: 07/09/2012  PCP: Junious Silk, MD Dr. Benedetto Goad at SNF  Admit date: 07/09/2012 Discharge date: 07/11/2012  Recommendations for Outpatient Follow-up:  1. F/u with orthopedics for post-op care - see below 2. Code status needs to be clarified - daughter wants patient full code   Discharge Diagnoses:  Principal Problem:  *Hip fracture, left Active Problems:  Hypothyroid  Osteoporosis with pathological fracture  Tobacco abuse  Hyperlipidemia  Closed left humeral fracture  Falls frequently  Hypothyroidism  Hepatitis B infection  Breast cancer  Hyperlipemia  Dementia  COPD (chronic obstructive pulmonary disease)  UTI (lower urinary tract infection)  Constipation  Anemia due to blood loss, acute   Discharge Condition: good  Diet recommendation: regular  Filed Weights   07/09/12 0934  Weight: 47.628 kg (105 lb)    History of present illness:  Emily Bautista is a 77 y.o. female with dementia, SNF resident was brought today to the Ed after she fell and was found to have a left hip fracture. She also had low sats in the ED . She is unable to provide any history due to dementia. According to family that patient can transfer with assistance holding her weight.    Hospital Course:  1. Left hip fracture- s/p orif 07/09/12 - did amazingly well postop , without delirium, minimal local pain and ability to sit in the recliner for hours on POD day 1 and 2. Postop f/u with Dr. Ranell Patrick - DVT prophylaxis with lovenox 2. ? UTI - culture not sent before abx - she received 3 days ov iv ancef. No symptoms before Dc, no fever. Stop abx  3. COPD - had to have supplemental oxygen in house - suspect has significant emphysema from life long smoking.   4. Ongoing tobacco abuse 5. Severe osteoporosis - definitely made worse by No4. C/w calcitonin spray calcium and vitamin D 6. HL - c/w statin  7. Dementia - at baseline   8. Hx of multiple fractures  9. Mild postop acute blood loss anemia - started iron 9/12 10. Constipation - received Miralax,and suppository   Consultants:  Beverely Low Procedures:  Left femur ORIF 9/11 - intramedullary nailing and DePuy Affixus Nail system     Discharge Exam: Filed Vitals:   07/10/12 2256 07/11/12 0000 07/11/12 0400 07/11/12 0610  BP: 97/52   96/53  Pulse: 98   96  Temp: 98.3 F (36.8 C)   98.6 F (37 C)  TempSrc:      Resp: 18 16 16 18   Height:      Weight:      SpO2: 91% 94% 95% 92%    General: axox1 Cardiovascular: RRR, no M,R,G Respiratory: ctab   Discharge Instructions      Discharge Orders    Future Orders Please Complete By Expires   Diet general      Increase activity slowly          Medication List     As of 07/11/2012 11:56 AM    STOP taking these medications         risperiDONE 0.5 MG tablet   Commonly known as: RISPERDAL      traMADol 50 MG tablet   Commonly known as: ULTRAM      TAKE these medications         acetaminophen 500 MG tablet   Commonly known as: TYLENOL   Take 1 tablet (500 mg total) by mouth 4 (  four) times daily.      aspirin 325 MG tablet   Take 325 mg by mouth daily.      calcitonin (salmon) 200 UNIT/ACT nasal spray   Commonly known as: MIACALCIN/FORTICAL   Place 1 spray into the nose daily.      calcium-vitamin D 500-200 MG-UNIT per tablet   Commonly known as: OSCAL WITH D   Take 1 tablet by mouth 2 (two) times daily.      cholecalciferol 1000 UNITS tablet   Commonly known as: VITAMIN D   Take 1,000 Units by mouth daily.      enoxaparin 30 MG/0.3ML injection   Commonly known as: LOVENOX   Inject 0.3 mLs (30 mg total) into the skin daily.      ezetimibe-simvastatin 10-40 MG per tablet   Commonly known as: VYTORIN   Take 1 tablet by mouth at bedtime.      HYDROcodone-acetaminophen 5-325 MG per tablet   Commonly known as: NORCO/VICODIN   Take 1 tablet by mouth every 8 (eight) hours as  needed for pain. For pain      iron polysaccharides 150 MG capsule   Commonly known as: NIFEREX   Take 1 capsule (150 mg total) by mouth 2 (two) times daily.      levothyroxine 88 MCG tablet   Commonly known as: SYNTHROID, LEVOTHROID   Take 88 mcg by mouth daily.      LORazepam 0.5 MG tablet   Commonly known as: ATIVAN   Take 0.5 tablets (0.25 mg total) by mouth every 8 (eight) hours as needed for anxiety. For anxiety      methocarbamol 500 MG tablet   Commonly known as: ROBAXIN   Take 1 tablet (500 mg total) by mouth every 6 (six) hours as needed (musckle spasms).      polyethylene glycol packet   Commonly known as: MIRALAX / GLYCOLAX   Take 17 g by mouth daily as needed. For constipation      senna-docusate 8.6-50 MG per tablet   Commonly known as: Senokot-S   Take 1 tablet by mouth daily.        Follow-up Information    Follow up with Pamelia Hoit, MD. (at [facility])    Contact information:   [address] [place] [address] [phone]       Follow up with NORRIS,STEVEN R, MD. Schedule an appointment as soon as possible for a visit in 2 weeks.   Contact information:   [facility] [address] [place] [address] [phone]           The results of significant diagnostics from this hospitalization (including imaging, microbiology, ancillary and laboratory) are listed below for reference.    Significant Diagnostic Studies: Dg Chest 1 View  07/09/2012  *RADIOLOGY REPORT*  Clinical Data: Preoperative respiratory evaluation prior to ORIF of the left femoral neck fracture.  CHEST - 1 VIEW  Comparison: Two-view chest x-ray 08/29/2011, 06/08/2010.  Findings: Cardiac silhouette upper normal in size for the AP technique.  Thoracic aorta tortuous and atherosclerotic, unchanged. Hilar and mediastinal contours otherwise unremarkable.  Lungs hyperinflated but clear.  Prior right mastectomy and axillary node dissection.  Osteopenia.   IMPRESSION: Hyperinflation consistent with COPD and/or asthma.  No acute cardiopulmonary disease.   Original Report Authenticated By: Arnell Sieving, M.D.    Dg Hip Complete Left  07/09/2012  *RADIOLOGY REPORT*  Clinical Data: Left hip pain.  LEFT HIP - COMPLETE 2+ VIEW  Comparison:  Left hip x-rays 11/25/2011.  Findings: Comminuted intertrochanteric left femoral neck fracture. Hip joint intact with moderate joint space narrowing and hypertrophic spurring involving the femoral head.  Osteopenia.  Included AP pelvis demonstrates symmetric moderate joint space narrowing in the contralateral right hip.  No fractures elsewhere. Sacroiliac joints intact with degenerative changes.  Symphysis pubis intact.  Iliofemoral atherosclerosis.  IMPRESSION: Comminuted intertrochanteric left femoral neck fracture.  Moderate osteoarthritis involving both hips.   Original Report Authenticated By: Arnell Sieving, M.D.    Dg Hip Operative Left  07/09/2012  *RADIOLOGY REPORT*  Clinical Data: Left hip fracture, ORIF.  OPERATIVE LEFT HIP  Comparison: 07/09/2012  Findings: Multiple intraoperative spot images demonstrate internal fixation across the left intertrochanteric fracture.  Near anatomic alignment.  No visible hardware complicating feature.  IMPRESSION: Internal fixation left intertrochanteric fracture.   Original Report Authenticated By: Cyndie Chime, M.D.    Ct Head Wo Contrast  07/09/2012  *RADIOLOGY REPORT*  Clinical Data: Larey Seat last night.  CT HEAD WITHOUT CONTRAST  Technique:  Contiguous axial images were obtained from the base of the skull through the vertex without contrast.  Comparison: 11/26/2011 MR.  11/25/2011 CT.  Findings: Motion degraded exam.  No skull fracture or intracranial hemorrhage.  Global atrophy without hydrocephalus.  Anterior left frontal extra-axial cystic septated structure unchanged which may represent an arachnoid cyst.  Small vessel disease type changes.  No CT evidence of large acute  thrombotic infarct.  Vascular calcifications.  IMPRESSION: Motion degraded exam.  No skull fracture or intracranial hemorrhage.  Global atrophy without hydrocephalus.  Anterior left frontal extra-axial cystic septated structure unchanged which may represent an arachnoid cyst.  Small vessel disease type changes.   Original Report Authenticated By: Fuller Canada, M.D.    Dg Pelvis Portable  07/09/2012  *RADIOLOGY REPORT*  Clinical Data: Postop, left hip  PORTABLE PELVIS  Comparison: Preoperative radiographs obtained earlier today, 07/09/2012 at 10:40 a.m.  Findings: Interval surgical changes of ORIF of the left intertrochanteric fracture with an intramedullary rod, single distal interlocking screw, and two proximal femoral neck gamma nails. A line is improved compared to the preoperative radiographs. No immediate hardware complication identified.  Scattered atherosclerotic vascular calcifications.  IMPRESSION: ORIF of left intertrochanteric fracture as above without evidence of immediate hardware complication.   Original Report Authenticated By: Vilma Prader     Microbiology: Recent Results (from the past 240 hour(s))  MRSA PCR SCREENING     Status: Normal   Collection Time   07/10/12  4:49 AM      Component Value Range Status Comment   MRSA by PCR NEGATIVE  NEGATIVE Final      Labs: Basic Metabolic Panel:  Lab 07/11/12 0865 07/10/12 0607 07/09/12 1006  NA 137 137 137  K 4.0 4.6 3.7  CL 103 103 100  CO2 28 24 27   GLUCOSE 92 108* 104*  BUN 17 17 25*  CREATININE 0.59 0.46* 0.60  CALCIUM 9.5 9.5 10.2  MG -- -- --  PHOS -- -- --   Liver Function Tests:  Lab 07/09/12 1006  AST 25  ALT 23  ALKPHOS 76  BILITOT 0.5  PROT 7.5  ALBUMIN 3.8   No results found for this basename: LIPASE:5,AMYLASE:5 in the last 168 hours No results found for this basename: AMMONIA:5 in the last 168 hours CBC:  Lab 07/11/12 0605 07/10/12 0607 07/09/12 1006  WBC 8.4 11.4* 10.9*  NEUTROABS -- -- 9.0*  HGB 9.4*  10.2* 12.5  HCT 27.7* 30.7* 37.1  MCV 91.4 90.8 90.5  PLT 140* 162 177   Cardiac Enzymes: No results found for this basename: CKTOTAL:5,CKMB:5,CKMBINDEX:5,TROPONINI:5 in the last 168 hours BNP: BNP (last 3 results) No results found for this basename: PROBNP:3 in the last 8760 hours CBG: No results found for this basename: GLUCAP:5 in the last 168 hours  Time coordinating discharge: 35 minutes  Signed:  Jood Retana  [facility] 07/11/2012, 11:56 AM

## 2012-07-11 NOTE — Evaluation (Signed)
Occupational Therapy Evaluation and Discharge Patient Details Name: Emily Bautista MRN: [id number] DOB: [birth year] Today's Date: 07/11/2012 Time: 4098-1191 OT Time Calculation (min): 26 min  OT Assessment / Plan / Recommendation Clinical Impression  Pt s/p left femur IM nailing after fall at SNF. Plan is for pt to d/c back to SNF this weekend. Will defer further OT to SNF.     OT Assessment  All further OT needs can be met in the next venue of care    Follow Up Recommendations  Skilled nursing facility;Supervision/Assistance - 24 hour    Barriers to Discharge      Equipment Recommendations  None recommended by OT;None recommended by PT    Recommendations for Other Services    Frequency       Precautions / Restrictions Precautions Precautions: Fall Precaution Comments: Family stated pt has had multiple falls and usually occurs when she tries to get up on her own. Restrictions RLE Weight Bearing: Weight bearing as tolerated LLE Weight Bearing: Weight bearing as tolerated   Pertinent Vitals/Pain Pt reports 4/10 pelvic pain. Pt pre-medicated    ADL  Grooming: Performed;Wash/dry face;Set up;Supervision/safety Where Assessed - Grooming: Supported sitting Lower Body Dressing: Performed;+2 Total assistance Lower Body Dressing: Patient Percentage: 10% Where Assessed - Lower Body Dressing: Supported sit to stand Toilet Transfer: Performed;+1 Total assistance Toilet Transfer Method: Surveyor, minerals: Materials engineer and Hygiene: Performed;+2 Total assistance Toileting - Architect and Hygiene: Patient Percentage: 10% Where Assessed - Glass blower/designer Manipulation and Hygiene: Standing Equipment Used: Gait belt Transfers/Ambulation Related to ADLs: Total A for stand pivot transfers, pt requires assist to shift/advance each foot.     OT Diagnosis: Generalized weakness;Acute pain  OT Problem List: Decreased  strength;Decreased activity tolerance;Impaired balance (sitting and/or standing);Decreased safety awareness;Decreased knowledge of use of DME or AE;Decreased knowledge of precautions;Pain OT Treatment Interventions:     OT Goals    Visit Information  Last OT Received On: 07/11/12 Assistance Needed: +2    Subjective Data  Subjective: Oh, him? He's just someone I picked up off the street. (referring to son in room) Patient Stated Goal: Use bathroom   Prior Functioning  Vision/Perception  Home Living Available Help at Discharge: Skilled Nursing Facility Type of Home: Skilled Nursing Facility Prior Function Level of Independence: Needs assistance Needs Assistance: Bathing;Dressing;Feeding;Grooming;Toileting;Meal Prep;Gait;Transfers Bath: Maximal Dressing: Maximal Feeding: Supervision/set-up Grooming: Moderate Toileting: Maximal Meal Prep: Unable to assess Gait Assistance: Total (A) +2 - only ambulates small steps to w/c <> bed, w/c<>chiar Driving: No Vocation: Retired Musician: No difficulties Dominant Hand: Right      Cognition  Overall Cognitive Status: History of cognitive impairments - at baseline Arousal/Alertness: Awake/alert Orientation Level: Disoriented to;Time;Situation;Place Behavior During Session: WFL for tasks performed    Extremity/Trunk Assessment Right Upper Extremity Assessment RUE ROM/Strength/Tone: Deficits RUE ROM/Strength/Tone Deficits: grossly 4/5 RUE Sensation: WFL - Light Touch Left Upper Extremity Assessment LUE ROM/Strength/Tone: Deficits LUE ROM/Strength/Tone Deficits: grossly 4/5 LUE Sensation: WFL - Light Touch   Mobility  Shoulder Instructions  Bed Mobility Supine to Sit: 1: +2 Total assist;HOB elevated Supine to Sit: Patient Percentage: 40% Details for Bed Mobility Assistance: use of pad for to rotate LE and hips to EOB, pt with pain during this despite LE support Transfers Transfers: Sit to Stand;Stand to  Sit Sit to Stand: 1: +1 Total assist;From bed;With upper extremity assist Sit to Stand: Patient Percentage: 50% Stand to Sit: 1: +1 Total assist;To chair/3-in-1 Stand to Sit:  Patient Percentage: 50% Details for Transfer Assistance: VC for hand placement on therapist (sit to stand) and on chair (stand to sit). Pt was able to achieve upright standing       Exercise     Balance Static Sitting Balance Static Sitting - Balance Support: Feet supported Static Sitting - Level of Assistance: 4: Min assist Static Sitting - Comment/# of Minutes: sitting on 3n1 for ~49min   End of Session OT - End of Session Equipment Utilized During Treatment: Gait belt Activity Tolerance: Patient limited by pain Patient left: in chair;with call bell/phone within reach;with family/visitor present (sitter in room)  GO     Eydan Chianese 07/11/2012, 10:21 AM

## 2016-11-18 ENCOUNTER — Inpatient Hospital Stay (HOSPITAL_COMMUNITY)
Admission: EM | Admit: 2016-11-18 | Discharge: 2016-11-21 | DRG: 480 | Disposition: A | Discharge status: Home / Self Care | Payer: Medicare Other | Attending: Family Medicine | Admitting: Family Medicine

## 2016-11-18 DIAGNOSIS — Z87891 Personal history of nicotine dependence: Secondary | ICD-10-CM

## 2016-11-18 DIAGNOSIS — Y92129 Unspecified place in nursing home as the place of occurrence of the external cause: Secondary | ICD-10-CM

## 2016-11-18 DIAGNOSIS — Z419 Encounter for procedure for purposes other than remedying health state, unspecified: Secondary | ICD-10-CM

## 2016-11-18 DIAGNOSIS — E876 Hypokalemia: Secondary | ICD-10-CM | POA: Diagnosis present

## 2016-11-18 DIAGNOSIS — S72144A Nondisplaced intertrochanteric fracture of right femur, initial encounter for closed fracture: Secondary | ICD-10-CM | POA: Diagnosis present

## 2016-11-18 DIAGNOSIS — W19XXXA Unspecified fall, initial encounter: Secondary | ICD-10-CM

## 2016-11-18 DIAGNOSIS — Z9011 Acquired absence of right breast and nipple: Secondary | ICD-10-CM

## 2016-11-18 DIAGNOSIS — Z9889 Other specified postprocedural states: Secondary | ICD-10-CM

## 2016-11-18 DIAGNOSIS — S72141A Displaced intertrochanteric fracture of right femur, initial encounter for closed fracture: Secondary | ICD-10-CM

## 2016-11-18 DIAGNOSIS — E039 Hypothyroidism, unspecified: Secondary | ICD-10-CM | POA: Diagnosis present

## 2016-11-18 DIAGNOSIS — Z66 Do not resuscitate: Secondary | ICD-10-CM | POA: Diagnosis present

## 2016-11-18 DIAGNOSIS — Z9049 Acquired absence of other specified parts of digestive tract: Secondary | ICD-10-CM

## 2016-11-18 DIAGNOSIS — N3001 Acute cystitis with hematuria: Secondary | ICD-10-CM

## 2016-11-18 DIAGNOSIS — J449 Chronic obstructive pulmonary disease, unspecified: Secondary | ICD-10-CM | POA: Diagnosis present

## 2016-11-18 DIAGNOSIS — D649 Anemia, unspecified: Secondary | ICD-10-CM | POA: Diagnosis not present

## 2016-11-18 DIAGNOSIS — E43 Unspecified severe protein-calorie malnutrition: Secondary | ICD-10-CM | POA: Insufficient documentation

## 2016-11-18 DIAGNOSIS — D62 Acute posthemorrhagic anemia: Secondary | ICD-10-CM | POA: Diagnosis not present

## 2016-11-18 DIAGNOSIS — Z853 Personal history of malignant neoplasm of breast: Secondary | ICD-10-CM

## 2016-11-18 DIAGNOSIS — Z8673 Personal history of transient ischemic attack (TIA), and cerebral infarction without residual deficits: Secondary | ICD-10-CM

## 2016-11-18 DIAGNOSIS — F0390 Unspecified dementia without behavioral disturbance: Secondary | ICD-10-CM | POA: Diagnosis present

## 2016-11-19 ENCOUNTER — Inpatient Hospital Stay (HOSPITAL_COMMUNITY): Payer: Medicare Other | Admitting: Certified Registered"

## 2016-11-19 ENCOUNTER — Encounter (HOSPITAL_COMMUNITY)
Admission: EM | Disposition: A | Discharge status: Home / Self Care | Payer: Self-pay | Source: Home / Self Care | Attending: Internal Medicine

## 2016-11-19 ENCOUNTER — Encounter (HOSPITAL_COMMUNITY): Payer: Self-pay | Admitting: Emergency Medicine

## 2016-11-19 ENCOUNTER — Emergency Department (HOSPITAL_COMMUNITY): Payer: Medicare Other

## 2016-11-19 ENCOUNTER — Inpatient Hospital Stay (HOSPITAL_COMMUNITY): Payer: Medicare Other

## 2016-11-19 DIAGNOSIS — E43 Unspecified severe protein-calorie malnutrition: Secondary | ICD-10-CM | POA: Diagnosis present

## 2016-11-19 DIAGNOSIS — J449 Chronic obstructive pulmonary disease, unspecified: Secondary | ICD-10-CM | POA: Diagnosis present

## 2016-11-19 DIAGNOSIS — S72144A Nondisplaced intertrochanteric fracture of right femur, initial encounter for closed fracture: Principal | ICD-10-CM | POA: Diagnosis present

## 2016-11-19 DIAGNOSIS — F0390 Unspecified dementia without behavioral disturbance: Secondary | ICD-10-CM | POA: Diagnosis not present

## 2016-11-19 DIAGNOSIS — Z8673 Personal history of transient ischemic attack (TIA), and cerebral infarction without residual deficits: Secondary | ICD-10-CM | POA: Diagnosis not present

## 2016-11-19 DIAGNOSIS — Z9889 Other specified postprocedural states: Secondary | ICD-10-CM | POA: Diagnosis not present

## 2016-11-19 DIAGNOSIS — D649 Anemia, unspecified: Secondary | ICD-10-CM | POA: Diagnosis present

## 2016-11-19 DIAGNOSIS — Z87891 Personal history of nicotine dependence: Secondary | ICD-10-CM | POA: Diagnosis not present

## 2016-11-19 DIAGNOSIS — E039 Hypothyroidism, unspecified: Secondary | ICD-10-CM | POA: Diagnosis present

## 2016-11-19 DIAGNOSIS — Z66 Do not resuscitate: Secondary | ICD-10-CM | POA: Diagnosis present

## 2016-11-19 DIAGNOSIS — Z9011 Acquired absence of right breast and nipple: Secondary | ICD-10-CM | POA: Diagnosis not present

## 2016-11-19 DIAGNOSIS — N3001 Acute cystitis with hematuria: Secondary | ICD-10-CM | POA: Diagnosis present

## 2016-11-19 DIAGNOSIS — Z9049 Acquired absence of other specified parts of digestive tract: Secondary | ICD-10-CM | POA: Diagnosis not present

## 2016-11-19 DIAGNOSIS — E876 Hypokalemia: Secondary | ICD-10-CM | POA: Diagnosis present

## 2016-11-19 DIAGNOSIS — D62 Acute posthemorrhagic anemia: Secondary | ICD-10-CM | POA: Diagnosis not present

## 2016-11-19 DIAGNOSIS — Z853 Personal history of malignant neoplasm of breast: Secondary | ICD-10-CM | POA: Diagnosis not present

## 2016-11-19 HISTORY — PX: INTRAMEDULLARY (IM) NAIL INTERTROCHANTERIC: SHX5875

## 2016-11-19 LAB — CBC WITH DIFFERENTIAL/PLATELET
Basophils Absolute: 0 10*3/uL (ref 0.0–0.1)
Basophils Relative: 0 %
Eosinophils Absolute: 0 10*3/uL (ref 0.0–0.7)
Eosinophils Relative: 0 %
HEMATOCRIT: 33.8 % — AB (ref 36.0–46.0)
HEMOGLOBIN: 11.1 g/dL — AB (ref 12.0–15.0)
LYMPHS PCT: 8 %
Lymphs Abs: 0.8 10*3/uL (ref 0.7–4.0)
MCH: 29.3 pg (ref 26.0–34.0)
MCHC: 32.8 g/dL (ref 30.0–36.0)
MCV: 89.2 fL (ref 78.0–100.0)
MONO ABS: 0.6 10*3/uL (ref 0.1–1.0)
MONOS PCT: 6 %
NEUTROS PCT: 86 %
Neutro Abs: 8.3 10*3/uL — ABNORMAL HIGH (ref 1.7–7.7)
Platelets: 165 10*3/uL (ref 150–400)
RBC: 3.79 MIL/uL — ABNORMAL LOW (ref 3.87–5.11)
RDW: 14.6 % (ref 11.5–15.5)
WBC: 9.7 10*3/uL (ref 4.0–10.5)

## 2016-11-19 LAB — URINALYSIS, ROUTINE W REFLEX MICROSCOPIC
BILIRUBIN URINE: NEGATIVE
Glucose, UA: NEGATIVE mg/dL
KETONES UR: NEGATIVE mg/dL
NITRITE: NEGATIVE
PH: 5 (ref 5.0–8.0)
Protein, ur: 30 mg/dL — AB
SPECIFIC GRAVITY, URINE: 1.018 (ref 1.005–1.030)

## 2016-11-19 LAB — COMPREHENSIVE METABOLIC PANEL
ALBUMIN: 3.2 g/dL — AB (ref 3.5–5.0)
ALT: 16 U/L (ref 14–54)
AST: 29 U/L (ref 15–41)
Alkaline Phosphatase: 81 U/L (ref 38–126)
Anion gap: 9 (ref 5–15)
BUN: 14 mg/dL (ref 6–20)
CALCIUM: 8.9 mg/dL (ref 8.9–10.3)
CHLORIDE: 107 mmol/L (ref 101–111)
CO2: 23 mmol/L (ref 22–32)
Creatinine, Ser: 0.69 mg/dL (ref 0.44–1.00)
GFR calc non Af Amer: >60 mL/min (ref >60)
GLUCOSE: 128 mg/dL — AB (ref 65–99)
POTASSIUM: 3.6 mmol/L (ref 3.5–5.1)
Sodium: 139 mmol/L (ref 135–145)
Total Bilirubin: 0.7 mg/dL (ref 0.3–1.2)
Total Protein: 6.3 g/dL — ABNORMAL LOW (ref 6.5–8.1)

## 2016-11-19 LAB — MRSA PCR SCREENING: MRSA by PCR: NEGATIVE

## 2016-11-19 LAB — ABO/RH: ABO/RH(D): A NEG

## 2016-11-19 LAB — TYPE AND SCREEN
ABO/RH(D): A NEG
ANTIBODY SCREEN: NEGATIVE

## 2016-11-19 SURGERY — FIXATION, FRACTURE, INTERTROCHANTERIC, WITH INTRAMEDULLARY ROD
Anesthesia: Spinal | Site: Hip | Laterality: Right

## 2016-11-19 MED ORDER — PROPOFOL 500 MG/50ML IV EMUL
INTRAVENOUS | Status: DC | PRN
  Administered 2016-11-19: 10 ug/kg/min via INTRAVENOUS

## 2016-11-19 MED ORDER — POLYETHYLENE GLYCOL 3350 17 G PO PACK: 17.0000 g | PACK | Freq: Every day | ORAL | Status: DC | PRN

## 2016-11-19 MED ORDER — EPHEDRINE SULFATE 50 MG/ML IJ SOLN
INTRAMUSCULAR | Status: DC | PRN
  Administered 2016-11-19: 10 mg via INTRAVENOUS

## 2016-11-19 MED ORDER — FENTANYL CITRATE (PF) 100 MCG/2ML IJ SOLN
INTRAMUSCULAR | Status: DC | PRN
  Administered 2016-11-19: 25 ug via INTRAVENOUS
  Administered 2016-11-19: 50 ug via INTRAVENOUS

## 2016-11-19 MED ORDER — PROPOFOL 10 MG/ML IV BOLUS
INTRAVENOUS | Status: AC
  Filled 2016-11-19: qty 20

## 2016-11-19 MED ORDER — FENTANYL CITRATE (PF) 100 MCG/2ML IJ SOLN
INTRAMUSCULAR | Status: AC
  Filled 2016-11-19: qty 2

## 2016-11-19 MED ORDER — PHENOL 1.4 % MT LIQD: 1.0000 | OROMUCOSAL | Status: DC | PRN

## 2016-11-19 MED ORDER — LIDOCAINE 2% (20 MG/ML) 5 ML SYRINGE
INTRAMUSCULAR | Status: AC
  Filled 2016-11-19: qty 5

## 2016-11-19 MED ORDER — ONDANSETRON HCL 4 MG/2ML IJ SOLN
INTRAMUSCULAR | Status: AC
  Filled 2016-11-19: qty 2

## 2016-11-19 MED ORDER — CEFTRIAXONE SODIUM 1 G IJ SOLR
1.0000 g | INTRAMUSCULAR | Status: DC
  Administered 2016-11-19 – 2016-11-21 (×3): 1 g via INTRAVENOUS
  Filled 2016-11-19 (×3): qty 10

## 2016-11-19 MED ORDER — PROMETHAZINE HCL 25 MG/ML IJ SOLN: 6.2500 mg | INTRAMUSCULAR | Status: DC | PRN

## 2016-11-19 MED ORDER — CEFAZOLIN SODIUM-DEXTROSE 2-4 GM/100ML-% IV SOLN
2.0000 g | INTRAVENOUS | Status: AC
  Administered 2016-11-19: 2 g via INTRAVENOUS
  Filled 2016-11-19 (×2): qty 100

## 2016-11-19 MED ORDER — LACTATED RINGERS IV SOLN
INTRAVENOUS | Status: DC
  Administered 2016-11-19: 50 mL/h via INTRAVENOUS
  Administered 2016-11-19: 19:00:00 via INTRAVENOUS

## 2016-11-19 MED ORDER — METOCLOPRAMIDE HCL 5 MG/ML IJ SOLN: 5.0000 mg | Freq: Three times a day (TID) | INTRAMUSCULAR | Status: DC | PRN

## 2016-11-19 MED ORDER — BISACODYL 10 MG RE SUPP: 10.0000 mg | Freq: Every day | RECTAL | Status: DC | PRN

## 2016-11-19 MED ORDER — HYDROCODONE-ACETAMINOPHEN 5-325 MG PO TABS
1.0000 | ORAL_TABLET | Freq: Four times a day (QID) | ORAL | Status: DC | PRN
  Administered 2016-11-19 – 2016-11-21 (×5): 1 via ORAL
  Filled 2016-11-19 (×5): qty 1

## 2016-11-19 MED ORDER — SODIUM CHLORIDE 0.9 % IV SOLN
INTRAVENOUS | Status: DC
  Administered 2016-11-19: via INTRAVENOUS

## 2016-11-19 MED ORDER — ROCURONIUM BROMIDE 50 MG/5ML IV SOSY
PREFILLED_SYRINGE | INTRAVENOUS | Status: AC
  Filled 2016-11-19: qty 5

## 2016-11-19 MED ORDER — SUFENTANIL CITRATE 50 MCG/ML IV SOLN
INTRAVENOUS | Status: AC
  Filled 2016-11-19: qty 1

## 2016-11-19 MED ORDER — MORPHINE SULFATE (PF) 2 MG/ML IV SOLN
1.0000 mg | INTRAVENOUS | Status: DC | PRN
  Administered 2016-11-19 – 2016-11-20 (×6): 1 mg via INTRAVENOUS
  Filled 2016-11-19 (×6): qty 1

## 2016-11-19 MED ORDER — METOCLOPRAMIDE HCL 5 MG PO TABS
5.0000 mg | ORAL_TABLET | Freq: Three times a day (TID) | ORAL | Status: DC | PRN
Start: 2016-11-19 — End: 2016-11-21

## 2016-11-19 MED ORDER — MORPHINE SULFATE (PF) 4 MG/ML IV SOLN
4.0000 mg | Freq: Once | INTRAVENOUS | Status: AC
  Administered 2016-11-19: 4 mg via INTRAVENOUS
  Filled 2016-11-19: qty 1

## 2016-11-19 MED ORDER — MORPHINE SULFATE (PF) 4 MG/ML IV SOLN
0.5000 mg | INTRAVENOUS | Status: DC | PRN
  Administered 2016-11-19: 0.52 mg via INTRAVENOUS

## 2016-11-19 MED ORDER — MENTHOL 3 MG MT LOZG: 1.0000 | LOZENGE | OROMUCOSAL | Status: DC | PRN

## 2016-11-19 MED ORDER — ONDANSETRON HCL 4 MG/2ML IJ SOLN
4.0000 mg | Freq: Once | INTRAMUSCULAR | Status: AC
  Administered 2016-11-19: 4 mg via INTRAVENOUS
  Filled 2016-11-19: qty 2

## 2016-11-19 MED ORDER — MORPHINE SULFATE (PF) 4 MG/ML IV SOLN
0.5000 mg | INTRAVENOUS | Status: DC | PRN
Start: 2016-11-19 — End: 2016-11-19
  Administered 2016-11-19 (×2): 0.52 mg via INTRAVENOUS
  Filled 2016-11-19 (×3): qty 1

## 2016-11-19 MED ORDER — ONDANSETRON HCL 4 MG/2ML IJ SOLN
INTRAMUSCULAR | Status: DC | PRN
  Administered 2016-11-19: 4 mg via INTRAVENOUS

## 2016-11-19 MED ORDER — ONDANSETRON HCL 4 MG/2ML IJ SOLN: 4.0000 mg | Freq: Four times a day (QID) | INTRAMUSCULAR | Status: DC | PRN

## 2016-11-19 MED ORDER — FENTANYL CITRATE (PF) 100 MCG/2ML IJ SOLN
INTRAMUSCULAR | Status: AC
  Filled 2016-11-19: qty 4

## 2016-11-19 MED ORDER — ACETAMINOPHEN 325 MG PO TABS
650.0000 mg | ORAL_TABLET | Freq: Four times a day (QID) | ORAL | Status: DC | PRN
  Administered 2016-11-20: 650 mg via ORAL
  Filled 2016-11-19: qty 2

## 2016-11-19 MED ORDER — EPHEDRINE 5 MG/ML INJ
INTRAVENOUS | Status: AC
  Filled 2016-11-19: qty 10

## 2016-11-19 MED ORDER — MORPHINE SULFATE (PF) 2 MG/ML IV SOLN: 0.5000 mg | INTRAVENOUS | Status: DC | PRN

## 2016-11-19 MED ORDER — CEFAZOLIN SODIUM-DEXTROSE 2-4 GM/100ML-% IV SOLN
2.0000 g | Freq: Four times a day (QID) | INTRAVENOUS | Status: AC
  Administered 2016-11-19 – 2016-11-20 (×2): 2 g via INTRAVENOUS
  Filled 2016-11-19 (×2): qty 100

## 2016-11-19 MED ORDER — PROPOFOL 500 MG/50ML IV EMUL
INTRAVENOUS | Status: AC
  Filled 2016-11-19: qty 50

## 2016-11-19 MED ORDER — FENTANYL CITRATE (PF) 100 MCG/2ML IJ SOLN
25.0000 ug | INTRAMUSCULAR | Status: DC | PRN
  Administered 2016-11-19: 25 ug via INTRAVENOUS

## 2016-11-19 MED ORDER — HYDROCODONE-ACETAMINOPHEN 5-325 MG PO TABS: 1.0000 | ORAL_TABLET | Freq: Four times a day (QID) | ORAL | Status: DC | PRN

## 2016-11-19 MED ORDER — 0.9 % SODIUM CHLORIDE (POUR BTL) OPTIME
TOPICAL | Status: DC | PRN
  Administered 2016-11-19: 1000 mL

## 2016-11-19 MED ORDER — DOCUSATE SODIUM 100 MG PO CAPS
100.0000 mg | ORAL_CAPSULE | Freq: Two times a day (BID) | ORAL | Status: DC
  Administered 2016-11-19 – 2016-11-21 (×4): 100 mg via ORAL
  Filled 2016-11-19 (×4): qty 1

## 2016-11-19 MED ORDER — BUPIVACAINE IN DEXTROSE 0.75-8.25 % IT SOLN
INTRATHECAL | Status: DC | PRN
  Administered 2016-11-19: 1.4 mL via INTRATHECAL

## 2016-11-19 MED ORDER — LEVOTHYROXINE SODIUM 75 MCG PO TABS
75.0000 ug | ORAL_TABLET | Freq: Every day | ORAL | Status: DC
  Administered 2016-11-20 – 2016-11-21 (×2): 75 ug via ORAL
  Filled 2016-11-19 (×4): qty 1

## 2016-11-19 MED ORDER — SODIUM CHLORIDE 0.9 % IV SOLN: INTRAVENOUS | Status: DC

## 2016-11-19 MED ORDER — ASPIRIN EC 325 MG PO TBEC
325.0000 mg | DELAYED_RELEASE_TABLET | Freq: Every day | ORAL | Status: DC
  Administered 2016-11-20 – 2016-11-21 (×2): 325 mg via ORAL
  Filled 2016-11-19 (×2): qty 1

## 2016-11-19 MED ORDER — ACETAMINOPHEN 650 MG RE SUPP: 650.0000 mg | Freq: Four times a day (QID) | RECTAL | Status: DC | PRN

## 2016-11-19 MED ORDER — CHLORHEXIDINE GLUCONATE 4 % EX LIQD: 60.0000 mL | Freq: Once | CUTANEOUS | Status: DC

## 2016-11-19 MED ORDER — ONDANSETRON HCL 4 MG PO TABS: 4.0000 mg | ORAL_TABLET | Freq: Four times a day (QID) | ORAL | Status: DC | PRN

## 2016-11-19 SURGICAL SUPPLY — 45 items
BLADE SURG 15 STRL LF DISP TIS (BLADE) ×1 IMPLANT
BLADE SURG 15 STRL SS (BLADE) ×2
BNDG GAUZE ELAST 4 BULKY (GAUZE/BANDAGES/DRESSINGS) ×3 IMPLANT
COVER PERINEAL POST (MISCELLANEOUS) ×3 IMPLANT
COVER SURGICAL LIGHT HANDLE (MISCELLANEOUS) ×3 IMPLANT
DRAPE STERI IOBAN 125X83 (DRAPES) ×3 IMPLANT
DRSG MEPILEX BORDER 4X4 (GAUZE/BANDAGES/DRESSINGS) ×6 IMPLANT
DRSG MEPILEX BORDER 4X8 (GAUZE/BANDAGES/DRESSINGS) ×3 IMPLANT
DRSG PAD ABDOMINAL 8X10 ST (GAUZE/BANDAGES/DRESSINGS) ×6 IMPLANT
DURAPREP 26ML APPLICATOR (WOUND CARE) ×3 IMPLANT
ELECT REM PT RETURN 9FT ADLT (ELECTROSURGICAL) ×3
ELECTRODE REM PT RTRN 9FT ADLT (ELECTROSURGICAL) ×1 IMPLANT
FACESHIELD WRAPAROUND (MASK) ×3 IMPLANT
GAUZE XEROFORM 1X8 LF (GAUZE/BANDAGES/DRESSINGS) ×3 IMPLANT
GAUZE XEROFORM 5X9 LF (GAUZE/BANDAGES/DRESSINGS) ×3 IMPLANT
GLOVE BIO SURGEON STRL SZ8 (GLOVE) ×3 IMPLANT
GLOVE BIOGEL PI IND STRL 8 (GLOVE) ×1 IMPLANT
GLOVE BIOGEL PI INDICATOR 8 (GLOVE) ×2
GLOVE ORTHO TXT STRL SZ7.5 (GLOVE) ×3 IMPLANT
GOWN STRL REUS W/ TWL LRG LVL3 (GOWN DISPOSABLE) ×1 IMPLANT
GOWN STRL REUS W/ TWL XL LVL3 (GOWN DISPOSABLE) ×2 IMPLANT
GOWN STRL REUS W/TWL LRG LVL3 (GOWN DISPOSABLE) ×2
GOWN STRL REUS W/TWL XL LVL3 (GOWN DISPOSABLE) ×4
GUIDE PIN 3.2X343 (PIN) ×2
GUIDE PIN 3.2X343MM (PIN) ×4
KIT BASIN OR (CUSTOM PROCEDURE TRAY) ×3 IMPLANT
KIT ROOM TURNOVER OR (KITS) ×3 IMPLANT
LINER BOOT UNIVERSAL DISP (MISCELLANEOUS) ×3 IMPLANT
MANIFOLD NEPTUNE II (INSTRUMENTS) ×3 IMPLANT
NAIL TRIGEN 10MMX36CM-125 RT (Nail) ×3 IMPLANT
NS IRRIG 1000ML POUR BTL (IV SOLUTION) ×3 IMPLANT
PACK GENERAL/GYN (CUSTOM PROCEDURE TRAY) ×3 IMPLANT
PAD ARMBOARD 7.5X6 YLW CONV (MISCELLANEOUS) ×6 IMPLANT
PAD CAST 4YDX4 CTTN HI CHSV (CAST SUPPLIES) ×2 IMPLANT
PADDING CAST COTTON 4X4 STRL (CAST SUPPLIES) ×4
PIN GUIDE 3.2X343MM (PIN) ×2 IMPLANT
SCREW LAG COMPR KIT 90/85 (Screw) ×3 IMPLANT
STAPLER VISISTAT 35W (STAPLE) ×3 IMPLANT
SUT VIC AB 0 CT1 27 (SUTURE) ×4
SUT VIC AB 0 CT1 27XBRD ANBCTR (SUTURE) ×2 IMPLANT
SUT VIC AB 2-0 CT1 27 (SUTURE) ×4
SUT VIC AB 2-0 CT1 TAPERPNT 27 (SUTURE) ×2 IMPLANT
TOWEL OR 17X24 6PK STRL BLUE (TOWEL DISPOSABLE) ×3 IMPLANT
TOWEL OR 17X26 10 PK STRL BLUE (TOWEL DISPOSABLE) ×3 IMPLANT
WATER STERILE IRR 1000ML POUR (IV SOLUTION) ×3 IMPLANT

## 2016-11-19 NOTE — ED Provider Notes (Signed)
[facility] DEPT Provider Note   CSN: [place]:[id number] Arrival date & time: 11/18/16  2347  By signing my name below, I, Oleh Genin, attest that this documentation has been prepared under the direction and in the presence of Delora Fuel, MD. Electronically Signed: Oleh Genin, Scribe. 11/19/16. 12:06 AM.   History   Chief Complaint Chief Complaint  Patient presents with  . Hip Injury   LEVEL 5 CAVEAT DUE TO DEMENTIA  HPI Emily Bautista is a 83 y.o. female with history of hypothyroidism and dementia who presents to the ED following a fall that occurred this morning. According to nursing staff at the patient's skilled nursing facility, this patient fell earlier this morning to her R hip and was subsequently seen at an outside facility. Here she received plain films but were not read by a radiologist and was discharged. The SNF believe there to be a clear R hip fracture and the patient was transferred to this facility for evaluation. When speaking to the patient, she is oriented to self only. She is not in acute distress. Nursing facility deny any changes in the patient's baseline mental status.    A complete history is limited by dementia.   The history is provided by the patient. No language interpreter was used.    Past Medical History:  Diagnosis Date  . Breast cancer ([place])   . Compression fracture of lumbar vertebra ([place])   . Dementia   . Hepatitis B infection   . Hyperlipemia   . Hypothyroidism   . Thyroid disease   . TIA (transient ischemic attack)     Patient Active Problem List   Diagnosis Date Noted  . Anemia due to blood loss, acute 07/11/2012  . Hip fracture, left ([place]) 07/09/2012  . Dementia 07/09/2012  . COPD (chronic obstructive pulmonary disease) ([place]) 07/09/2012  . UTI (lower urinary tract infection) 07/09/2012  . Constipation 07/09/2012  . Hypothyroidism   . TIA (transient ischemic attack)   . Hepatitis B infection   . Compression fracture of  lumbar vertebra ([place])   . Breast cancer ([place])   . Hyperlipemia   . Osteoporosis 11/26/2011  . Closed left humeral fracture 11/26/2011  . Falls frequently 11/26/2011  . Abnormal CT scan of head 11/26/2011  . Compression fracture 09/02/2011  . Hypothyroid 09/02/2011  . Osteoporosis with pathological fracture 09/02/2011  . Tobacco abuse 09/02/2011  . Asymptomatic bacteriuria 09/02/2011  . Hyperlipidemia 09/02/2011    Past Surgical History:  Procedure Laterality Date  . APPENDECTOMY    . FEMUR IM NAIL  07/09/2012   Procedure: INTRAMEDULLARY (IM) NAIL FEMORAL;  Surgeon: Augustin Schooling, MD;  Location: [facility];  Service: Orthopedics;  Laterality: Left;  Marland Kitchen MASTECTOMY     right sided  . WRIST SURGERY      OB History    No data available       Home Medications    Prior to Admission medications   Medication Sig Start Date End Date Taking? Authorizing Provider  aspirin 325 MG tablet Take 325 mg by mouth daily.     Historical Provider, MD  calcium-vitamin D (OSCAL WITH D) 500-200 MG-UNIT per tablet Take 1 tablet by mouth 2 (two) times daily.    Historical Provider, MD  cholecalciferol (VITAMIN D) 1000 UNITS tablet Take 1,000 Units by mouth daily.    Historical Provider, MD  enoxaparin (LOVENOX) 30 MG/0.3ML injection Inject 0.3 mLs (30 mg total) into the skin daily. 07/11/12 07/25/12  Sorin June Leap, MD  ezetimibe-simvastatin (VYTORIN) 10-40 MG per tablet Take 1 tablet by mouth at bedtime.     Historical Provider, MD  HYDROcodone-acetaminophen (NORCO/VICODIN) 5-325 MG per tablet Take 1 tablet by mouth every 8 (eight) hours as needed for pain. For pain 07/11/12   Sorin June Leap, MD  iron polysaccharides (NIFEREX) 150 MG capsule Take 1 capsule (150 mg total) by mouth 2 (two) times daily. 07/11/12 07/11/13  Sorin June Leap, MD  levothyroxine (SYNTHROID, LEVOTHROID) 88 MCG tablet Take 88 mcg by mouth daily.     Historical Provider, MD  LORazepam (ATIVAN) 0.5 MG tablet Take 0.5 tablets (0.25 mg total) by  mouth every 8 (eight) hours as needed for anxiety. For anxiety 07/11/12   Sorin June Leap, MD  polyethylene glycol (MIRALAX / GLYCOLAX) packet Take 17 g by mouth daily as needed. For constipation    Historical Provider, MD  senna-docusate (SENOKOT-S) 8.6-50 MG per tablet Take 1 tablet by mouth daily.    Historical Provider, MD    Family History History reviewed. No pertinent family history.  Social History Social History  Substance Use Topics  . Smoking status: Former Smoker    Packs/day: 0.00    Years: 60.00  . Smokeless tobacco: Former Systems developer     Comment: current uses smokless cigarettes  . Alcohol use No     Allergies   Patient has no known allergies.   Review of Systems Review of Systems  Unable to perform ROS: Dementia     Physical Exam Updated Vital Signs BP 157/86 (BP Location: Right Arm)   Pulse 83   Temp 98.4 F (36.9 C) (Oral)   Resp 16   SpO2 98%   Physical Exam  Constitutional: She appears well-developed. She appears cachectic.  HENT:  Head: Normocephalic and atraumatic.  Eyes: EOM are normal. Pupils are equal, round, and reactive to light.  Neck: Normal range of motion. Neck supple. No JVD present.  Cardiovascular: Normal rate, regular rhythm and normal heart sounds.   No murmur heard. Pulmonary/Chest: Effort normal and breath sounds normal. She has no wheezes. She has no rales. She exhibits no tenderness.  Abdominal: Soft. Bowel sounds are normal. She exhibits no distension and no mass. There is no tenderness.  Musculoskeletal: She exhibits no edema.  The patient's R leg is externally rotated with pain on passive movement. There is moderate pain to the R hip on exam.   Lymphadenopathy:    She has no cervical adenopathy.  Neurological: She is alert. No cranial nerve deficit. She exhibits normal muscle tone. Coordination normal.  The patient is oriented to person. However she is not oriented to place or time. There is moderate cogwheel rigidity.   Skin:  Skin is warm and dry. No rash noted.  Psychiatric: She has a normal mood and affect. Her behavior is normal. Judgment and thought content normal.  Nursing note and vitals reviewed.    ED Treatments / Results  DIAGNOSTIC STUDIES: Oxygen Saturation is 98 percent on room air which is normal by my interpretation.    COORDINATION OF CARE: 12:10 AM First encounter.   Labs (all labs ordered are listed, but only abnormal results are displayed) Labs Reviewed  COMPREHENSIVE METABOLIC PANEL - Abnormal; Notable for the following:       Result Value   Glucose, Bld 128 (*)    Total Protein 6.3 (*)    Albumin 3.2 (*)    All other components within normal limits  CBC WITH DIFFERENTIAL/PLATELET - Abnormal; Notable for the following:  RBC 3.79 (*)    Hemoglobin 11.1 (*)    HCT 33.8 (*)    Neutro Abs 8.3 (*)    All other components within normal limits  URINALYSIS, ROUTINE W REFLEX MICROSCOPIC  TYPE AND SCREEN  ABO/RH    EKG  EKG Interpretation  Date/Time:  Monday November 19 2016 00:32:12 EST Ventricular Rate:  75 PR Interval:    QRS Duration: 107 QT Interval:  438 QTC Calculation: 490 R Axis:   4 Text Interpretation:  Sinus rhythm Probable left atrial enlargement Borderline repol abnormality, diffuse leads Borderline prolonged QT interval When compared with ECG of 07/09/2012, QT has lengthened Confirmed by Roxanne Mins  MD, Geovonni Meyerhoff ([id number]) on 11/19/2016 12:42:17 AM       Radiology Dg Chest 1 View  Result Date: 11/19/2016 CLINICAL DATA:  83 year old female with fall and right hip fracture. EXAM: CHEST 1 VIEW COMPARISON:  Chest radiograph dated 07/09/2012 FINDINGS: There is mild emphysematous changes of the lungs with mild chronic interstitial coarsening. There is no focal consolidation, pleural effusion, or pneumothorax. The cardiac silhouette is within normal limits. The aorta is tortuous. There is atherosclerotic calcification of the aorta. Right axillary surgical clips noted. There is  osteopenia with extensive degenerative changes of the spine. Old healed left posterior rib fractures. Multiple lumbar compression deformity as well as vertebroplasty changes noted. A 1 cm round calcific density noted in the left upper abdomen. IMPRESSION: No acute cardiopulmonary process. Atherosclerotic calcification of the aorta with aortic tortuosity. Degenerative changes, osteopenia, and multilevel compression deformity of the lumbar spine. Electronically Signed   By: Anner Crete M.D.   On: 11/19/2016 01:49   Dg Hip Unilat W Or Wo Pelvis 2-3 Views Right  Result Date: 11/19/2016 CLINICAL DATA:  83 y/o  F; right hip fracture. EXAM: DG HIP (WITH OR WITHOUT PELVIS) 2-3V RIGHT COMPARISON:  None. FINDINGS: Mildly comminuted intertrochanteric fracture of the right proximal femur with apex anterior and coxa vera angulation. The femoral head is well seated in the acetabulum. Partially visualized left femoral nail and 2 interlocking threaded screws without appreciable hardware related complication or fracture. No pelvic fracture is identified. Vascular calcifications. Extensive degenerative changes of lower lumbar spine. IMPRESSION: Mildly comminuted intertrochanteric fracture of the right proximal femur with apex anterior and coxa vera angulation. The femoral head is well seated in the acetabulum. Electronically Signed   By: Kristine Garbe M.D.   On: 11/19/2016 01:49    Procedures Procedures (including critical care time)  Medications Ordered in ED Medications  morphine 4 MG/ML injection 4 mg (not administered)  ondansetron (ZOFRAN) injection 4 mg (not administered)     Initial Impression / Assessment and Plan / ED Course  I have reviewed the triage vital signs and the nursing notes.  Pertinent labs & imaging results that were available during my care of the patient were reviewed by me and considered in my medical decision making (see chart for details).  Fall at nursing home with  known hip fracture. Unfortunate, I cannot access the x-rays done there. X-rays here confirms intertrochanteric fracture of the right hip. Screening labs are obtained as well as preop ECG and chest x-ray which are unremarkable. Case is discussed with Dr. Ninfa Linden, on call for with [facility], who states that he will operate her on her sometime in the late afternoon. Case is discussed with Dr. Loleta Books of [facility] who agrees to admit the patient.  Final Clinical Impressions(s) / ED Diagnoses   Final diagnoses:  Fall at nursing home,  initial encounter  Closed intertrochanteric fracture of right hip, initial encounter (HCC)  Normochromic normocytic anemia    New Prescriptions New Prescriptions   No medications on file   I personally performed the services described in this documentation, which was scribed in my presence. The recorded information has been reviewed and is accurate.      Delora Fuel, MD [id number] [phone]

## 2016-11-19 NOTE — Anesthesia Postprocedure Evaluation (Addendum)
Anesthesia Post Note  Patient: Emily Bautista  Procedure(s) Performed: Procedure(s) (LRB): INTRAMEDULLARY (IM) NAIL INTERTROCHANTRIC/HIP SCREW RIGHT HIP (Right)  Patient location during evaluation: PACU Anesthesia Type: Spinal Level of consciousness: oriented and awake and alert Pain management: pain level controlled Vital Signs Assessment: post-procedure vital signs reviewed and stable Respiratory status: spontaneous breathing, respiratory function stable and patient connected to nasal cannula oxygen Cardiovascular status: blood pressure returned to baseline and stable Postop Assessment: no headache and no backache Anesthetic complications: no       Last Vitals:  Vitals:   11/19/16 1900 11/19/16 1915  BP: (!) 107/55 (!) 104/51  Pulse: 73 74  Resp: 16 16  Temp: 36.5 C     Last Pain:  Vitals:   11/19/16 1900  TempSrc:   PainSc: 0-No pain                 Shanieka Blea S

## 2016-11-19 NOTE — ED Notes (Addendum)
Pt has foley in place, very little urine noted in foley cath tubing (foley was placed at 0430) . Scanned pt bladder and bladder scan read 87mL

## 2016-11-19 NOTE — Anesthesia Procedure Notes (Signed)
Spinal  Patient location during procedure: OR Start time: 11/19/2016 5:35 PM End time: 11/19/2016 5:50 PM Staffing Anesthesiologist: Oleta Mouse Preanesthetic Checklist Completed: patient identified, surgical consent, pre-op evaluation, timeout performed, IV checked, risks and benefits discussed and monitors and equipment checked Spinal Block Patient position: left lateral decubitus Prep: ChloraPrep and site prepped and draped Patient monitoring: heart rate, cardiac monitor, continuous pulse ox and blood pressure Approach: midline Location: L4-5 Injection technique: single-shot Needle Needle type: Pencan and Quincke  Needle gauge: 22 G Needle length: 10 cm Assessment Sensory level: T6

## 2016-11-19 NOTE — ED Notes (Signed)
Pt placed on monitor for o2 sat due to receiving IV morphine. Pt stats at 95%

## 2016-11-19 NOTE — Consult Note (Signed)
Reason for Consult:  Right hip fracture Referring Physician: Roxanne Mins, MD  Emily Bautista is an 83 y.o. female.  HPI:   83 yo female with dementia who stays in a nursing care facility.  Sustained some type of mechanical fall and was brought to the ED.  She was found to have a displaced right hip intertrochanteric fracture and Ortho is consulted for further evaluation and treatment.  She is awake, but does not follow commands and is obviously demented.  Family is not at the bedside currently.  [facility] is graciously admitting the patient for medical management.  Past Medical History:  Diagnosis Date  . Breast cancer ([place])   . Compression fracture of lumbar vertebra ([place])   . Dementia   . Hepatitis B infection   . Hyperlipemia   . Hypothyroidism   . Thyroid disease   . TIA (transient ischemic attack)     Past Surgical History:  Procedure Laterality Date  . APPENDECTOMY    . FEMUR IM NAIL  07/09/2012   Procedure: INTRAMEDULLARY (IM) NAIL FEMORAL;  Surgeon: Augustin Schooling, MD;  Location: [facility];  Service: Orthopedics;  Laterality: Left;  Marland Kitchen MASTECTOMY     right sided  . WRIST SURGERY      History reviewed. No pertinent family history.  Social History:  reports that she has quit smoking. She smoked 0.00 packs per day for 60.00 years. She has quit using smokeless tobacco. She reports that she does not drink alcohol or use drugs.  Allergies: No Known Allergies  Medications: I have reviewed the patient's current medications.  Results for orders placed or performed during the hospital encounter of 11/18/16 (from the past 48 hour(s))  Comprehensive metabolic panel     Status: Abnormal   Collection Time: 11/19/16 12:17 AM  Result Value Ref Range   Sodium 139 135 - 145 mmol/L   Potassium 3.6 3.5 - 5.1 mmol/L   Chloride 107 101 - 111 mmol/L   CO2 23 22 - 32 mmol/L   Glucose, Bld 128 (H) 65 - 99 mg/dL   BUN 14 6 - 20 mg/dL   Creatinine, Ser 0.69 0.44 - 1.00 mg/dL   Calcium 8.9 8.9 - 10.3 mg/dL    Total Protein 6.3 (L) 6.5 - 8.1 g/dL   Albumin 3.2 (L) 3.5 - 5.0 g/dL   AST 29 15 - 41 U/L   ALT 16 14 - 54 U/L   Alkaline Phosphatase 81 38 - 126 U/L   Total Bilirubin 0.7 0.3 - 1.2 mg/dL   GFR calc non Af Amer >60 >60 mL/min   GFR calc Af Amer >60 >60 mL/min    Comment: (NOTE) The eGFR has been calculated using the CKD EPI equation. This calculation has not been validated in all clinical situations. eGFR's persistently <60 mL/min signify possible Chronic Kidney Disease.    Anion gap 9 5 - 15  CBC with Differential     Status: Abnormal   Collection Time: 11/19/16 12:17 AM  Result Value Ref Range   WBC 9.7 4.0 - 10.5 K/uL   RBC 3.79 (L) 3.87 - 5.11 MIL/uL   Hemoglobin 11.1 (L) 12.0 - 15.0 g/dL   HCT 33.8 (L) 36.0 - 46.0 %   MCV 89.2 78.0 - 100.0 fL   MCH 29.3 26.0 - 34.0 pg   MCHC 32.8 30.0 - 36.0 g/dL   RDW 14.6 11.5 - 15.5 %   Platelets 165 150 - 400 K/uL   Neutrophils Relative % 86 %  Neutro Abs 8.3 (H) 1.7 - 7.7 K/uL   Lymphocytes Relative 8 %   Lymphs Abs 0.8 0.7 - 4.0 K/uL   Monocytes Relative 6 %   Monocytes Absolute 0.6 0.1 - 1.0 K/uL   Eosinophils Relative 0 %   Eosinophils Absolute 0.0 0.0 - 0.7 K/uL   Basophils Relative 0 %   Basophils Absolute 0.0 0.0 - 0.1 K/uL  Type and screen [place]     Status: None   Collection Time: 11/19/16  1:33 AM  Result Value Ref Range   ABO/RH(D) A NEG    Antibody Screen NEG    Sample Expiration 11/22/2016   ABO/Rh     Status: None (Preliminary result)   Collection Time: 11/19/16  1:33 AM  Result Value Ref Range   ABO/RH(D) A NEG   Urinalysis, Routine w reflex microscopic     Status: Abnormal   Collection Time: 11/19/16  4:38 AM  Result Value Ref Range   Color, Urine AMBER (A) YELLOW    Comment: BIOCHEMICALS MAY BE AFFECTED BY COLOR   APPearance TURBID (A) CLEAR   Specific Gravity, Urine 1.018 1.005 - 1.030   pH 5.0 5.0 - 8.0   Glucose, UA NEGATIVE NEGATIVE mg/dL   Hgb urine dipstick SMALL (A)  NEGATIVE   Bilirubin Urine NEGATIVE NEGATIVE   Ketones, ur NEGATIVE NEGATIVE mg/dL   Protein, ur 30 (A) NEGATIVE mg/dL   Nitrite NEGATIVE NEGATIVE   Leukocytes, UA MODERATE (A) NEGATIVE   RBC / HPF 0-5 0 - 5 RBC/hpf   WBC, UA TOO NUMEROUS TO COUNT 0 - 5 WBC/hpf   Bacteria, UA MANY (A) NONE SEEN   Squamous Epithelial / LPF TOO NUMEROUS TO COUNT (A) NONE SEEN   WBC Clumps PRESENT     Dg Chest 1 View  Result Date: 11/19/2016 CLINICAL DATA:  83 year old female with fall and right hip fracture. EXAM: CHEST 1 VIEW COMPARISON:  Chest radiograph dated 07/09/2012 FINDINGS: There is mild emphysematous changes of the lungs with mild chronic interstitial coarsening. There is no focal consolidation, pleural effusion, or pneumothorax. The cardiac silhouette is within normal limits. The aorta is tortuous. There is atherosclerotic calcification of the aorta. Right axillary surgical clips noted. There is osteopenia with extensive degenerative changes of the spine. Old healed left posterior rib fractures. Multiple lumbar compression deformity as well as vertebroplasty changes noted. A 1 cm round calcific density noted in the left upper abdomen. IMPRESSION: No acute cardiopulmonary process. Atherosclerotic calcification of the aorta with aortic tortuosity. Degenerative changes, osteopenia, and multilevel compression deformity of the lumbar spine. Electronically Signed   By: Anner Crete M.D.   On: 11/19/2016 01:49   Dg Hip Unilat W Or Wo Pelvis 2-3 Views Right  Result Date: 11/19/2016 CLINICAL DATA:  83 y/o  F; right hip fracture. EXAM: DG HIP (WITH OR WITHOUT PELVIS) 2-3V RIGHT COMPARISON:  None. FINDINGS: Mildly comminuted intertrochanteric fracture of the right proximal femur with apex anterior and coxa vera angulation. The femoral head is well seated in the acetabulum. Partially visualized left femoral nail and 2 interlocking threaded screws without appreciable hardware related complication or fracture. No  pelvic fracture is identified. Vascular calcifications. Extensive degenerative changes of lower lumbar spine. IMPRESSION: Mildly comminuted intertrochanteric fracture of the right proximal femur with apex anterior and coxa vera angulation. The femoral head is well seated in the acetabulum. Electronically Signed   By: Kristine Garbe M.D.   On: 11/19/2016 01:49    ROS Blood pressure  108/61, pulse 77, temperature 98.4 F (36.9 C), temperature source Oral, resp. rate 18, SpO2 97 %. Physical Exam  Musculoskeletal:       Right hip: She exhibits decreased range of motion, bony tenderness and deformity.  Neurological: She is alert.  Psychiatric: Cognition and memory are impaired.    Assessment/Plan: Displaced right hip intertrochanteric fracture 1)  The plan will be to proceed to surgery late today.  Will discuss this with the family and assess their wishes.  Surgery will be for pain control and quality of life.  Will discuss risks and benefits with the family.  Mcarthur Rossetti 11/19/2016, 7:05 AM

## 2016-11-19 NOTE — ED Notes (Signed)
Hibicleanse bath performed as directed prior to surgery

## 2016-11-19 NOTE — ED Notes (Signed)
Transferred from stretcher to hospital bed for comfort.

## 2016-11-19 NOTE — ED Triage Notes (Signed)
Tc to OR , Pt is scheduled for surgery at 5 PM.

## 2016-11-19 NOTE — ED Triage Notes (Signed)
PT. Emily Bautista -  [place] [id number] . Son lives in [place].C.

## 2016-11-19 NOTE — H&P (Signed)
History and Physical  Patient Name: Emily Bautista     [id number]    DOB: Aug 08, 1922    DOA: 11/18/2016 Referring provider: Zollie Beckers, MD PCP: Limmie Patricia, MD      Patient coming from: SNF     Chief Complaint: Hip pain and fall  HPI: Emily Bautista is a 83 y.o. female with a past medical history significant for dementia, COPD, and hypothyroidism who presents with hip pain after a fall.  All history collected from chart, as patient is oriented to self only and cannot provider her own history.  Evidently, she fell 1/21 on her right hip, was unable to walk after.  Radiograph was obtained (portable?), but was either not read or was interpreted as normal, and so patient was not brought in until today because staff felt clinically she had a hip fracture.   In the ED, the patient was oriented to self, in moderate pain of the right hip, and a plain radiograph of the RIGHT hip showed a non-displaced comminuted intertrochanteric hip fracture.  The case was discussed with Dr. Ninfa Linden who agreed to see the patient, and TRH were asked to admit for medical management.    The patient does not endorse chest discomfort, palpitations, or dyspnea.  States she feels well.  Is tired by my questioning.  She has no recorded history of TIAs/CVAs, CAD, CHF, or DM treated with insulin. She has history of COPD in chart, but does not use inhalers per MAR.    Review of Systems:  Review of Systems  Unable to perform ROS: Dementia          Past Medical History:  Diagnosis Date  . Breast cancer ([place])   . Compression fracture of lumbar vertebra ([place])   . Dementia   . Hepatitis B infection   . Hyperlipemia   . Hypothyroidism   . Thyroid disease   . TIA (transient ischemic attack)     Past Surgical History:  Procedure Laterality Date  . APPENDECTOMY    . FEMUR IM NAIL  07/09/2012   Procedure: INTRAMEDULLARY (IM) NAIL FEMORAL;  Surgeon: Augustin Schooling, MD;  Location: [facility];  Service:  Orthopedics;  Laterality: Left;  Marland Kitchen MASTECTOMY     right sided  . WRIST SURGERY      Social History:  Unable to obtain due to patient dementia.  Reportedly former smoker.  No Known Allergies  Family history: Unable to obtain due to patient dementia  Prior to Admission medications   Medication Sig Start Date End Date Taking? Authorizing Provider  acetaminophen (TYLENOL) 500 MG tablet Take 500 mg by mouth every 6 (six) hours as needed for mild pain.   Yes Historical Provider, MD  acetaminophen-codeine (TYLENOL #3) 300-30 MG tablet Take 1 tablet by mouth every 6 (six) hours as needed for moderate pain.   Yes Historical Provider, MD  cholecalciferol (VITAMIN D) 1000 UNITS tablet Take 1,000 Units by mouth daily.   Yes Historical Provider, MD  guaiFENesin-dextromethorphan (ROBITUSSIN DM) 100-10 MG/5ML syrup Take 15 mLs by mouth every 4 (four) hours as needed for cough.   Yes Historical Provider, MD  levothyroxine (SYNTHROID, LEVOTHROID) 75 MCG tablet Take 75 mcg by mouth daily before breakfast.   Yes Historical Provider, MD  loperamide (IMODIUM A-D) 2 MG tablet Take 2 mg by mouth 4 (four) times daily as needed for diarrhea or loose stools.   Yes Historical Provider, MD  magnesium hydroxide (MILK OF MAGNESIA) 400 MG/5ML suspension Take 30 mLs  by mouth daily as needed for mild constipation.   Yes Historical Provider, MD  polyethylene glycol (MIRALAX / GLYCOLAX) packet Take 17 g by mouth daily as needed. For constipation   Yes Historical Provider, MD  senna-docusate (SENOKOT-S) 8.6-50 MG per tablet Take 2 tablets by mouth daily.    Yes Historical Provider, MD  traZODone (DESYREL) 50 MG tablet Take 50 mg by mouth at bedtime.   Yes Historical Provider, MD       Physical Exam: BP 117/67   Pulse 76   Temp 98.4 F (36.9 C) (Oral)   Resp 13   SpO2 99%  General appearance: Frail elderly adult female, sleeping, rousbale, gives one word answers, in no obvious distress.   Eyes: Anicteric,  conjunctiva pink, lids and lashes normal. PERRL.    ENT: No nasal deformity, discharge, epistaxis.  Hearing normal or diminished, hard to say. OP dry.    Skin: Warm and dry.  No jaundice.  No suspicious rashes or lesions. Cardiac: RRR, nl S1-S2, no murmurs appreciated.  Capillary refill is brisk.  JVP not visible.  No LE edema.  Radial and DP pulses 2+ and symmetric.  No carotid bruits. Respiratory: Normal respiratory rate and rhythm.  CTAB without rales or wheezes. GI: Abdomen soft without rigidity.  No TTP. No ascites, distension, no hepatosplenomegaly.  MSK: No obvious deformity.  No effusions.  No clubbing or cyanosis. Neuro: Sensorium intact and responding to questions, sleepy but rousable.  Moves all extremities equally and with normal coordination.    Psych: Unable to assess.    Labs on Admission:  I have personally reviewed following labs and imaging studies: CBC:  Recent Labs Lab 11/19/16 0017  WBC 9.7  NEUTROABS 8.3*  HGB 11.1*  HCT 33.8*  MCV 89.2  PLT 123XX123   Basic Metabolic Panel:  Recent Labs Lab 11/19/16 0017  NA 139  K 3.6  CL 107  CO2 23  GLUCOSE 128*  BUN 14  CREATININE 0.69  CALCIUM 8.9   GFR: CrCl cannot be calculated (Unknown ideal weight.).  Liver Function Tests:  Recent Labs Lab 11/19/16 0017  AST 29  ALT 16  ALKPHOS 81  BILITOT 0.7  PROT 6.3*  ALBUMIN 3.2*   No results for input(s): LIPASE, AMYLASE in the last 168 hours. No results for input(s): AMMONIA in the last 168 hours. Coagulation Profile: No results for input(s): INR, PROTIME in the last 168 hours. Cardiac Enzymes: No results for input(s): CKTOTAL, CKMB, CKMBINDEX, TROPONINI in the last 168 hours. BNP (last 3 results) No results for input(s): PROBNP in the last 8760 hours. HbA1C: No results for input(s): HGBA1C in the last 72 hours. CBG: No results for input(s): GLUCAP in the last 168 hours. Lipid Profile: No results for input(s): CHOL, HDL, LDLCALC, TRIG, CHOLHDL,  LDLDIRECT in the last 72 hours. Thyroid Function Tests: No results for input(s): TSH, T4TOTAL, FREET4, T3FREE, THYROIDAB in the last 72 hours. Anemia Panel: No results for input(s): VITAMINB12, FOLATE, FERRITIN, TIBC, IRON, RETICCTPCT in the last 72 hours.    Radiological Exams on Admission: Personally reviewed CXR shows no pneumonia, chronic interstitial changes of COPD: Dg Chest 1 View  Result Date: 11/19/2016 CLINICAL DATA:  83 year old female with fall and right hip fracture. EXAM: CHEST 1 VIEW COMPARISON:  Chest radiograph dated 07/09/2012 FINDINGS: There is mild emphysematous changes of the lungs with mild chronic interstitial coarsening. There is no focal consolidation, pleural effusion, or pneumothorax. The cardiac silhouette is within normal limits. The aorta is tortuous.  There is atherosclerotic calcification of the aorta. Right axillary surgical clips noted. There is osteopenia with extensive degenerative changes of the spine. Old healed left posterior rib fractures. Multiple lumbar compression deformity as well as vertebroplasty changes noted. A 1 cm round calcific density noted in the left upper abdomen. IMPRESSION: No acute cardiopulmonary process. Atherosclerotic calcification of the aorta with aortic tortuosity. Degenerative changes, osteopenia, and multilevel compression deformity of the lumbar spine. Electronically Signed   By: Anner Crete M.D.   On: 11/19/2016 01:49   Dg Hip Unilat W Or Wo Pelvis 2-3 Views Right  Result Date: 11/19/2016 CLINICAL DATA:  83 y/o  F; right hip fracture. EXAM: DG HIP (WITH OR WITHOUT PELVIS) 2-3V RIGHT COMPARISON:  None. FINDINGS: Mildly comminuted intertrochanteric fracture of the right proximal femur with apex anterior and coxa vera angulation. The femoral head is well seated in the acetabulum. Partially visualized left femoral nail and 2 interlocking threaded screws without appreciable hardware related complication or fracture. No pelvic  fracture is identified. Vascular calcifications. Extensive degenerative changes of lower lumbar spine. IMPRESSION: Mildly comminuted intertrochanteric fracture of the right proximal femur with apex anterior and coxa vera angulation. The femoral head is well seated in the acetabulum. Electronically Signed   By: Kristine Garbe M.D.   On: 11/19/2016 01:49    EKG: Independently reviewed. Rate 75, QTc 490.  No ST changes.    Assessment and Plan: 1. Hip fracture: The patient will be seen by Dr. Ninfa Linden in the morning at [facility], to evaluate for operative fixation of the RIGHT hip.   -Admit to med-surg bed -Hydrocodone-acetaminophen or morphine as tolerated for pain -Bed rest, apply ice, document sedation and vitals per Hip fracture protocol -NPO after breakfast, likely afternoon case -Nutrition consulted    Overall, the patient is at low risk for the planned surgery.  Patient has a RCRI score of 0 (active cardiac condition, CHF, CAD, DM treated with insulin, TIA/CVA, Cr > 2.0).  -No further testing needed    2. Hypothyroidism: -Continue levothyroxine  3. Dementia: Delirium precautions:   -Lights and TV off, minimize interruptions at night  -Blinds open and lights on during day  -Glasses/hearing aid with patient  -Frequent reorientation  -PT/OT when able  -Avoid sedation medications/Beers list medications             DVT prophylaxis: SCDs  Diet: NPO after lunch Code Status: DO NOT RESUSCITATE, form at bedside  Family Communication: None present  Disposition Plan: Anticipate evaluation by Orthopedics and  surgical fixation tomorrow, then PT evaluation and discharge to SNF in 2-3 days. Admission status: INPATIENT for hip fracture, medical surgical bed     Medical decision making and consults: Patient seen at 4:24 AM on 11/19/2016.  The patient was discussed with Dr. Roxanne Mins. What exists of the patient's chart was reviewed in depth and summarized above.  Clinical  condition: stable.      Edwin Dada [facility] Pager [phone]

## 2016-11-19 NOTE — Transfer of Care (Signed)
Immediate Anesthesia Transfer of Care Note  Patient: Emily Bautista  Procedure(s) Performed: Procedure(s): INTRAMEDULLARY (IM) NAIL INTERTROCHANTRIC/HIP SCREW RIGHT HIP (Right)  Patient Location: PACU  Anesthesia Type:MAC and Spinal  Level of Consciousness: awake  Airway & Oxygen Therapy: Patient Spontanous Breathing and Patient connected to face mask oxygen  Post-op Assessment: Report given to RN and Post -op Vital signs reviewed and stable  Post vital signs: Reviewed and stable  Last Vitals:  Vitals:   11/19/16 1140 11/19/16 1532  BP: (!) 114/53 106/83  Pulse: 80 71  Resp: 18 18  Temp:  36.6 C    Last Pain:  Vitals:   11/19/16 1609  TempSrc:   PainSc: 5          Complications: No apparent anesthesia complications

## 2016-11-19 NOTE — ED Triage Notes (Signed)
TC to OR  Left Son's  Cell  number with Gretta Cool so DR Jean Rosenthal can call son to discuss  The planned  Surgery on Mother ,Emily Bautista. I then called the son to up date him with this information.

## 2016-11-19 NOTE — Anesthesia Preprocedure Evaluation (Signed)
Anesthesia Evaluation  Patient identified by MRN, date of birth, ID bandGeneral Assessment Comment:Sleeping   Reviewed: Allergy & Precautions, NPO status , Patient's Chart, lab work & pertinent test results  History of Anesthesia Complications Negative for: history of anesthetic complications  Airway Mallampati: II  TM Distance: >3 FB Neck ROM: Full    Dental  (+) Dental Advisory Given   Pulmonary COPD, former smoker,    breath sounds clear to auscultation       Cardiovascular negative cardio ROS   Rhythm:Regular     Neuro/Psych PSYCHIATRIC DISORDERS DementiaTIA   GI/Hepatic negative GI ROS, (+) Hepatitis -, B  Endo/Other  Hypothyroidism   Renal/GU      Musculoskeletal   Abdominal   Peds  Hematology  (+) anemia ,   Anesthesia Other Findings   Reproductive/Obstetrics                             Anesthesia Physical Anesthesia Plan  ASA: III  Anesthesia Plan: Spinal   Post-op Pain Management:    Induction:   Airway Management Planned: Natural Airway, Simple Face Mask and Nasal Cannula  Additional Equipment: None  Intra-op Plan:   Post-operative Plan:   Informed Consent: I have reviewed the patients History and Physical, chart, labs and discussed the procedure including the risks, benefits and alternatives for the proposed anesthesia with the patient or authorized representative who has indicated his/her understanding and acceptance.   Dental advisory given and Consent reviewed with POA  Plan Discussed with: CRNA and Surgeon  Anesthesia Plan Comments:         Anesthesia Quick Evaluation

## 2016-11-19 NOTE — Addendum Note (Signed)
Addendum  created 11/19/16 2105 by Myrtie Soman, MD   Order list changed, Order sets accessed

## 2016-11-19 NOTE — ED Triage Notes (Addendum)
Pt arrives to the ED by [facility] from skilled nursing facility [facility]. The facility called EMS because the doctor had not read the x-rays from the fall that happened the morning of 1/21. Per facility clear hip fracture on the R side. Pt received 150 of fentanyl with EMS. Pt is a DNR, form at bedside.

## 2016-11-19 NOTE — Progress Notes (Addendum)
Patient resides at [facility]- this has been confirmed with facility. Inpatient CSW to follow for disposition and return to SNF when medically appropriate.    Lorrine Kin, MSW, LCSW [facility] ED/52M Clinical Social Worker [phone]

## 2016-11-19 NOTE — Progress Notes (Signed)
Patient is seen & examined. Please see today's H&P for the details.  83 y.o. female with a past medical history significant for dementia, COPD, and hypothyroidism who presents with hip pain after a fall. Found to have right hip fracture. Scheduled for surgery today for pain control, quality of life per orthopedics. Patient is DNR. Also found to have UTI. Start iv ceftriaxone. D/w patient, her family.  Kinnie Feil

## 2016-11-19 NOTE — Anesthesia Procedure Notes (Signed)
Procedure Name: MAC Date/Time: 11/19/2016 6:00 PM Performed by: Lance Coon Pre-anesthesia Checklist: Patient identified, Emergency Drugs available, Suction available, Patient being monitored and Timeout performed Patient Re-evaluated:Patient Re-evaluated prior to inductionOxygen Delivery Method: Simple face mask

## 2016-11-19 NOTE — Brief Op Note (Signed)
11/18/2016 - 11/19/2016  7:05 PM  PATIENT:  Emily Bautista  83 y.o. female  PRE-OPERATIVE DIAGNOSIS:  right intertroch hip fracture  POST-OPERATIVE DIAGNOSIS:  Right Intertroch hip fracture  PROCEDURE:  Procedure(s): INTRAMEDULLARY (IM) NAIL INTERTROCHANTRIC/HIP SCREW RIGHT HIP (Right)  SURGEON:  Surgeon(s) and Role:    * Mcarthur Rossetti, MD - Primary  ANESTHESIA:   spinal  COUNTS:  YES  DICTATION: .Other Dictation: Dictation Number [id number]  PLAN OF CARE: Admit to inpatient   PATIENT DISPOSITION:  PACU - hemodynamically stable.   Delay start of Pharmacological VTE agent (>24hrs) due to surgical blood loss or risk of bleeding: no

## 2016-11-19 NOTE — Care Management Note (Signed)
Case Management Note  Patient Details  Name: Emily Bautista MRN: [id number] Date of Birth: 1922-01-17  Subjective/Objective:                  From [place] is a 83 y.o. female with history of hypothyroidism and dementia who presents to the ED following a fall that occurred this morning. According to nursing staff at the patient's skilled nursing facility, this patient fell earlier this morning to her R hip and was subsequently seen at an outside facility.  Action/Plan: Admit status INPATIENT (HIP SURGERY); anticipate discharge SNF.   Expected Discharge Date:  11/23/16               Expected Discharge Plan:  Skilled Nursing Facility  In-House Referral:  Clinical Social Work  Discharge planning Services  CM Consult  Post Acute Care Choice:  Durable Medical Equipment, Home Health Choice offered to:     DME Arranged:    DME Agency:     HH Arranged:    [place] Agency:     Status of Service:  In process, will continue to follow  If discussed at Long Length of Stay Meetings, dates discussed:    Additional Comments:  Fuller Mandril, RN 11/19/2016, 10:01 AM

## 2016-11-19 NOTE — ED Notes (Signed)
Report called to 6N,

## 2016-11-20 ENCOUNTER — Encounter (HOSPITAL_COMMUNITY): Payer: Self-pay | Admitting: Orthopaedic Surgery

## 2016-11-20 DIAGNOSIS — N3001 Acute cystitis with hematuria: Secondary | ICD-10-CM

## 2016-11-20 DIAGNOSIS — E43 Unspecified severe protein-calorie malnutrition: Secondary | ICD-10-CM | POA: Insufficient documentation

## 2016-11-20 LAB — CBC
HCT: 28 % — ABNORMAL LOW (ref 36.0–46.0)
HEMOGLOBIN: 9 g/dL — AB (ref 12.0–15.0)
MCH: 29.3 pg (ref 26.0–34.0)
MCHC: 32.1 g/dL (ref 30.0–36.0)
MCV: 91.2 fL (ref 78.0–100.0)
PLATELETS: 135 10*3/uL — AB (ref 150–400)
RBC: 3.07 MIL/uL — AB (ref 3.87–5.11)
RDW: 15.2 % (ref 11.5–15.5)
WBC: 8 10*3/uL (ref 4.0–10.5)

## 2016-11-20 LAB — BASIC METABOLIC PANEL
ANION GAP: 5 (ref 5–15)
BUN: 8 mg/dL (ref 6–20)
CO2: 24 mmol/L (ref 22–32)
Calcium: 8.1 mg/dL — ABNORMAL LOW (ref 8.9–10.3)
Chloride: 106 mmol/L (ref 101–111)
Creatinine, Ser: 0.6 mg/dL (ref 0.44–1.00)
Glucose, Bld: 107 mg/dL — ABNORMAL HIGH (ref 65–99)
POTASSIUM: 3.4 mmol/L — AB (ref 3.5–5.1)
SODIUM: 135 mmol/L (ref 135–145)

## 2016-11-20 MED ORDER — ASPIRIN 325 MG PO TBEC: 325.0000 mg | DELAYED_RELEASE_TABLET | Freq: Every day | ORAL | 0 refills | Status: DC

## 2016-11-20 MED ORDER — BOOST / RESOURCE BREEZE PO LIQD
1.0000 | Freq: Three times a day (TID) | ORAL | Status: DC
  Administered 2016-11-20 – 2016-11-21 (×3): 1 via ORAL

## 2016-11-20 NOTE — Progress Notes (Signed)
Initial Nutrition Assessment  DOCUMENTATION CODES:   Severe malnutrition in context of chronic illness  INTERVENTION:  Magic Cup with meals, each supplement will provide 290 calories and 9 g of protein Mighty Shake with meals, each supplement will provide 500 calories and 23 g of protein Boost Breeze TID, each supplement will provide 250 calories and 9 g of protein Kozy Shack Pudding TID between meals. each supplement will provide 140 calories and 4 g of protein  NUTRITION DIAGNOSIS:   Malnutrition related to chronic illness as evidenced by severe depletion of muscle mass, severe depletion of body fat.  GOAL:   Patient will meet greater than or equal to 90% of their needs  MONITOR:   PO intake, Supplement acceptance, Labs, Weight trends, I & O's, Skin  REASON FOR ASSESSMENT:   Consult Hip fracture protocol  ASSESSMENT:   83 y.o. female with a PMHx of dementia, COPD, and hypothyroidism who presents with hip pain after a fall  Pt was lethargic at time of visit. Son was in room to provide history. PTA pt resided at a nursing facility ([facility]).   Son stated pt seems to have had recent weight loss but was unsure of UBW. No recent weight history was found. Per distant weight history (2013) pt was around 105 lbs. Obtained current weight from bed-scale, unsure of accuracy.   Son stated appetite PTA was not very good but enjoys sweets/desserts. Per paper record pt was on a low salt, mechanical soft diet PTA. Also paper record stated pt received Med Pass, 206 Juice, Mighty Shake TID and Magic Cup BID. Observed pt breakfast meal tray, pt consumed few bites of grits and eggs and 100% of milk. Son believes she will accept the recommended supplements and stated "anything with sugar".  Nutrition focused physical exam showed severe fat wasting, severe muscle wasting, and unable to assess edema.   Diet Order:  Diet regular Room service appropriate? Yes; Fluid consistency: Thin  Skin:   Reviewed, no issues  Last BM:  PTA  Height:   Ht Readings from Last 1 Encounters:  11/20/16 5\' 2"  (1.575 m)    Weight:   Wt Readings from Last 1 Encounters:  11/20/16 115 lb 12.8 oz (52.5 kg)    Ideal Body Weight:  50 kg  BMI:  Body mass index is 21.18 kg/m.  Estimated Nutritional Needs:   Kcal:  1200-1400  Protein:  55-65 g   Fluid:  >/= 1.5 L  EDUCATION NEEDS:   No education needs identified at this time   Parks Ranger Dietetic Intern

## 2016-11-20 NOTE — Progress Notes (Signed)
[facility] PROGRESS NOTE  Emily Bautista [id number] DOB: 1922-10-28 DOA: 11/18/2016 PCP: Limmie Patricia, MD  Assessment/Plan: 83 y.o.femalewith a past medical history significant for dementia, COPD, and hypothyroidismwho presents with hip pain after a fall. Found to have right hip fracture.   Recent  Fall. Right hip fracture. Underwent surgery for pain control, quality of life per orthopedics on 1/22. -mild post op anemia. No s/s of acute bleeding. Recheck labs AM  UTI. Cont iv ceftriaxone, mild fever, improving. D/c foley  Dementia. Monitor for delirium post op  Hypothyroidism. Cont levothyroxine   Patient is DNR.  D/w patient, her family.   Code Status: DNR Family Communication: d/w patient, her family (indicate person spoken with, relationship, and if by phone, the number) Disposition Plan: SNF 24-48 hrs    Consultants:  Ortho   Procedures:  Right hip nailing   Antibiotics:  periop   ceftriaxone 1/22>> (indicate start date, and stop date if known)  HPI/Subjective: Alert, no distress. Reports feeling better today. Denies acute pains.   Objective: Vitals:   11/20/16 0203 11/20/16 0433  BP: 112/88 (!) 97/54  Pulse: 88 87  Resp: 17 17  Temp: 98.6 F (37 C) 100.2 F (37.9 C)    Intake/Output Summary (Last 24 hours) at 11/20/16 1251 Last data filed at 11/20/16 0600  Gross per 24 hour  Intake             1620 ml  Output              850 ml  Net              770 ml   Filed Weights   11/20/16 1040  Weight: 52.5 kg (115 lb 12.8 oz)    Exam:   General:  comfortable   Cardiovascular: s1,s2 rrr  Respiratory: CTA BL  Abdomen: soft, nt, nd   Musculoskeletal: no pedal edema    Data Reviewed: Basic Metabolic Panel:  Recent Labs Lab 11/19/16 0017 11/20/16 0428  NA 139 135  K 3.6 3.4*  CL 107 106  CO2 23 24  GLUCOSE 128* 107*  BUN 14 8  CREATININE 0.69 0.60  CALCIUM 8.9 8.1*   Liver Function Tests:  Recent Labs Lab  11/19/16 0017  AST 29  ALT 16  ALKPHOS 81  BILITOT 0.7  PROT 6.3*  ALBUMIN 3.2*   No results for input(s): LIPASE, AMYLASE in the last 168 hours. No results for input(s): AMMONIA in the last 168 hours. CBC:  Recent Labs Lab 11/19/16 0017 11/20/16 0428  WBC 9.7 8.0  NEUTROABS 8.3*  --   HGB 11.1* 9.0*  HCT 33.8* 28.0*  MCV 89.2 91.2  PLT 165 135*   Cardiac Enzymes: No results for input(s): CKTOTAL, CKMB, CKMBINDEX, TROPONINI in the last 168 hours. BNP (last 3 results) No results for input(s): BNP in the last 8760 hours.  ProBNP (last 3 results) No results for input(s): PROBNP in the last 8760 hours.  CBG: No results for input(s): GLUCAP in the last 168 hours.  Recent Results (from the past 240 hour(s))  MRSA PCR Screening     Status: None   Collection Time: 11/19/16  4:35 PM  Result Value Ref Range Status   MRSA by PCR NEGATIVE NEGATIVE Final    Comment:        The GeneXpert MRSA Assay (FDA approved for NASAL specimens only), is one component of a comprehensive MRSA colonization surveillance program. It is not intended to diagnose MRSA infection nor  to guide or monitor treatment for MRSA infections.      Studies: Dg Chest 1 View  Result Date: 11/19/2016 CLINICAL DATA:  83 year old female with fall and right hip fracture. EXAM: CHEST 1 VIEW COMPARISON:  Chest radiograph dated 07/09/2012 FINDINGS: There is mild emphysematous changes of the lungs with mild chronic interstitial coarsening. There is no focal consolidation, pleural effusion, or pneumothorax. The cardiac silhouette is within normal limits. The aorta is tortuous. There is atherosclerotic calcification of the aorta. Right axillary surgical clips noted. There is osteopenia with extensive degenerative changes of the spine. Old healed left posterior rib fractures. Multiple lumbar compression deformity as well as vertebroplasty changes noted. A 1 cm round calcific density noted in the left upper abdomen.  IMPRESSION: No acute cardiopulmonary process. Atherosclerotic calcification of the aorta with aortic tortuosity. Degenerative changes, osteopenia, and multilevel compression deformity of the lumbar spine. Electronically Signed   By: Anner Crete M.D.   On: 11/19/2016 01:49   Dg C-arm 1-60 Min  Result Date: 11/19/2016 CLINICAL DATA:  Right femoral fracture EXAM: OPERATIVE RIGHT HIP WITH PELVIS; DG C-ARM 61-120 MIN COMPARISON:  None. FLUOROSCOPY TIME:  Radiation Exposure Index (as provided by the fluoroscopic device): Not available If the device does not provide the exposure index: Fluoroscopy Time:  1 minutes 29 seconds Number of Acquired Images:  4 FINDINGS: Femoral medullary rod with fixation screws are seen. Fracture fragments are in near anatomic alignment. IMPRESSION: ORIF of proximal right femoral fracture. Electronically Signed   By: Inez Catalina M.D.   On: 11/19/2016 19:35   Dg Hip Operative Unilat With Pelvis Right  Result Date: 11/19/2016 CLINICAL DATA:  Right femoral fracture EXAM: OPERATIVE RIGHT HIP WITH PELVIS; DG C-ARM 61-120 MIN COMPARISON:  None. FLUOROSCOPY TIME:  Radiation Exposure Index (as provided by the fluoroscopic device): Not available If the device does not provide the exposure index: Fluoroscopy Time:  1 minutes 29 seconds Number of Acquired Images:  4 FINDINGS: Femoral medullary rod with fixation screws are seen. Fracture fragments are in near anatomic alignment. IMPRESSION: ORIF of proximal right femoral fracture. Electronically Signed   By: Inez Catalina M.D.   On: 11/19/2016 19:35   Dg Hip Unilat W Or Wo Pelvis 2-3 Views Right  Result Date: 11/19/2016 CLINICAL DATA:  83 y/o  F; right hip fracture. EXAM: DG HIP (WITH OR WITHOUT PELVIS) 2-3V RIGHT COMPARISON:  None. FINDINGS: Mildly comminuted intertrochanteric fracture of the right proximal femur with apex anterior and coxa vera angulation. The femoral head is well seated in the acetabulum. Partially visualized left  femoral nail and 2 interlocking threaded screws without appreciable hardware related complication or fracture. No pelvic fracture is identified. Vascular calcifications. Extensive degenerative changes of lower lumbar spine. IMPRESSION: Mildly comminuted intertrochanteric fracture of the right proximal femur with apex anterior and coxa vera angulation. The femoral head is well seated in the acetabulum. Electronically Signed   By: Kristine Garbe M.D.   On: 11/19/2016 01:49    Scheduled Meds: . aspirin EC  325 mg Oral Q breakfast  . cefTRIAXone (ROCEPHIN)  IV  1 g Intravenous Q24H  . docusate sodium  100 mg Oral BID  . feeding supplement  1 Container Oral TID BM  . levothyroxine  75 mcg Oral QAC breakfast   Continuous Infusions: . sodium chloride 75 mL/hr at 11/19/16 2352    Principal Problem:   Closed nondisplaced intertrochanteric fracture of right femur [facility]) Active Problems:   Hypothyroidism   Dementia   COPD (  chronic obstructive pulmonary disease) (HCC)   Normocytic anemia   Protein-calorie malnutrition, severe    Time spent: >35 minutes     Kinnie Feil  [facility] Pager [phone]. If 7PM-7AM, please contact night-coverage at [email or web address], password [facility] 11/20/2016, 12:51 PM  LOS: 1 day

## 2016-11-20 NOTE — Op Note (Signed)
Emily Bautista, SICKLE NO.:  [id number]  MEDICAL RECORD NO.:  [id number]  LOCATION:  A01C                         FACILITY:  [facility]  PHYSICIAN:  Lind Guest. Ninfa Linden, M.D.DATE OF BIRTH:  Feb 10, 1922  DATE OF PROCEDURE:  11/19/2016 DATE OF DISCHARGE:                              OPERATIVE REPORT   PREOPERATIVE DIAGNOSIS:  Displaced comminuted right hip intertrochanteric fracture.  POSTOPERATIVE DIAGNOSIS:  Displaced comminuted right hip intertrochanteric fracture.  PROCEDURE:  Open reduction and internal fixation of right comminuted intertrochanteric hip fracture using intramedullary rod and hip screw construct.  IMPLANTS:  [employer] 10 x 360 INTERTAN femoral nail with a 90-85 dual compression lag screw construct.  SURGEON:  Lind Guest. Ninfa Linden, M.D.  ANESTHESIA:  Spinal.  ANTIBIOTICS:  2 g of IV Ancef.  BLOOD LOSS:  Less than 200 mL.  COMPLICATIONS:  None.  INDICATIONS:  Ms. Krishnamoorthy is a very pleasant, but demented 83 year old female, who sustained a mechanical fall at her skilled nursing facility yesterday.  She was found to have a right intertrochanteric hip fracture and she was brought to the emergency room.  She was admitted to the Medicine Service and worked up and was stabilized for surgery.  I talked to her who is the healthcare power of attorney at length about proceeding with surgery for her fracture due to the pain she is having and her immobility due to this.  She has had a previous left hip fracture 5 years ago that was treated similarly.  After thorough discussion of risks and benefits, informed consent was obtained and her left hip was marked.  PROCEDURE DESCRIPTION:  After informed consent was obtained, appropriate right hip was marked, and she was brought to the operating room.  Spinal anesthesia was obtained while she was on her stretcher.  She already had a Foley catheter was placed.  Next, she was placed supine on  the fracture table with the perineal post in place, her right leg in in-line skeletal traction, and her left hip flexed and abducted out of the field in an abduction stirrup and appropriate padding.  We then assessed her right hip fracture under direct fluoroscopy and with traction and internal rotation, got it adequately reduced.  We then prepped her right hip with DuraPrep and sterile drapes.  Time-out was called and she was identified as correct patient and correct right hip.  I then made an incision proximal to the greater trochanter and dissected down to the tip of the greater trochanter.  A temporary guide pin was placed in an antegrade fashion.  An initiating reamer was used to open up the femoral canal.  We then had already taken measurement of our femoral nail prior to prepping and draping keeping it in sterile box choosing a 10 x 360 nail under direct fluoroscopy.  We had already pass this off to the back table sterilely and then we placed this intramedullary nail without needing to ream down the femoral canal easily.  We verified it's place under direct fluoroscopy.  We then made a separate lateral incision using the outrigger guide and drilled for dual compression lag screw construct.  We drilled to the level  of 90 for the lag screw and 85 for the compression screw.  Once we got good compression, we removed all instrumentation.  We put the hip through range of motion and moved as a unit.  We then irrigated the 2 small wounds with normal saline solution, closed the deep tissue with 0-Vicryl followed by 2-0 Vicryl in the subcutaneous tissue, interrupted staples on the skin.  Xeroform and well- padded sterile dressing were applied.  She was taken off the fracture table and taken to the recovery room in stable condition.  All final counts were correct.  There were no complications noted.     Lind Guest. Ninfa Linden, M.D.     CYB/MEDQ  D:  11/19/2016  T:  11/20/2016  Job:   [id number]

## 2016-11-20 NOTE — Evaluation (Signed)
Physical Therapy Evaluation Patient Details Name: Emily Bautista MRN: [id number] DOB: Mar 19, 1922 Today's Date: 11/20/2016   History of Present Illness  83 y.o.femalewith a past medical history significant for dementia, COPD, and hypothyroidismwho presents with hip pain after a fall. Found to have right hip fracture. Now s/p ORIF with IM Nail and intertrochanteric/hip screw, WBAT  Clinical Impression  Patient is s/p above surgery resulting in functional limitations due to the deficits listed below (see PT Problem List). Very pleasant, smiling, and agreeable; still, quite painful with any hip motion; Will pick up for acute PT on a trial basis, depending on ability to participate;  Patient will benefit from skilled PT to increase their independence and safety with mobility, decr caregiver burden, and  to allow discharge to the venue listed below.       Follow Up Recommendations SNF;Supervision/Assistance - 24 hour    Equipment Recommendations   (to be determined at SNF)    Recommendations for Other Services       Precautions / Restrictions Precautions Precautions: Fall Restrictions Weight Bearing Restrictions: Yes RLE Weight Bearing: Weight bearing as tolerated      Mobility  Bed Mobility Overal bed mobility: Needs Assistance Bed Mobility: Supine to Sit     Supine to sit: Total assist;+2 for physical assistance     General bed mobility comments: Needing total assist for all aspects of bed mobility; used bed pad to move and square off hips at EOB; support given at back and to support RLE  Transfers Overall transfer level: Needs assistance Equipment used: 1 person hand held assist (bilateral support at trunk with gait belt) Transfers: Stand Pivot Transfers   Stand pivot transfers: Total assist       General transfer comment: Total assist to stand pivot transfer bed to chair placed on pt's L side; very painful during transfer, but pain subsided quickly  Ambulation/Gait                Stairs            Wheelchair Mobility    Modified Rankin (Stroke Patients Only)       Balance                                             Pertinent Vitals/Pain Pain Assessment: Faces Faces Pain Scale: Hurts whole lot Pain Location: R hip with movement Pain Descriptors / Indicators: Grimacing;Guarding Pain Intervention(s): Monitored during session;Repositioned    Home Living Family/patient expects to be discharged to:: Skilled nursing facility                      Prior Function Level of Independence: Needs assistance   Gait / Transfers Assistance Needed: non-ambulatory for approx 4 years; staff assist with transfer to wheelchair           Hand Dominance        Extremity/Trunk Assessment   Upper Extremity Assessment Upper Extremity Assessment: Generalized weakness    Lower Extremity Assessment Lower Extremity Assessment: RLE deficits/detail RLE Deficits / Details: Grossly decr AROM and strength, limited by pain; tending to keep feet and ankles crossed RLE: Unable to fully assess due to pain       Communication   Communication: Other (comment) (tends to repeat statements; dementia)  Cognition Arousal/Alertness: Awake/alert Behavior During Therapy: WFL for tasks assessed/performed Overall Cognitive Status: History  of cognitive impairments - at baseline                      General Comments      Exercises     Assessment/Plan    PT Assessment Patient needs continued PT services (trial basis, pending ability to participate)  PT Problem List Decreased strength;Decreased range of motion;Decreased activity tolerance;Decreased balance;Decreased mobility;Decreased coordination;Decreased cognition;Decreased knowledge of use of DME;Decreased safety awareness;Decreased knowledge of precautions;Pain          PT Treatment Interventions DME instruction;Functional mobility training;Therapeutic  activities;Therapeutic exercise;Balance training;Cognitive remediation;Patient/family education    PT Goals (Current goals can be found in the Care Plan section)  Acute Rehab PT Goals Patient Stated Goal: Did not state PT Goal Formulation: With patient/family Time For Goal Achievement: 12/04/16 Potential to Achieve Goals: Good    Frequency Min 3X/week   Barriers to discharge        Co-evaluation               End of Session Equipment Utilized During Treatment: Gait belt Activity Tolerance: Patient limited by pain Patient left: in chair;with call bell/phone within reach;with chair alarm set;with family/visitor present Nurse Communication: Mobility status         Time: 1121-1139 PT Time Calculation (min) (ACUTE ONLY): 18 min   Charges:   PT Evaluation $PT Eval Moderate Complexity: 1 Procedure     PT G CodesColletta Maryland 11/20/2016, 2:23 PM  Roney Marion, [place] Pager [phone] Office [phone]

## 2016-11-20 NOTE — Progress Notes (Signed)
OT NOTE  Pt is Medicare and current D/C plan is SNF. No apparent immediate acute care OT needs, therefore will defer OT to SNF. If OT eval is needed please call Acute Rehab Dept. at [phone] or text page OT at [phone].

## 2016-11-20 NOTE — Discharge Instructions (Signed)
Dry dressing as needed right hip incisions. Can get incisions wet daily if needed. Only up with assistance given her fall risk.  Weight as tolerated on right hip.

## 2016-11-20 NOTE — Progress Notes (Signed)
Subjective: 1 Day Post-Op Procedure(s) (LRB): INTRAMEDULLARY (IM) NAIL INTERTROCHANTRIC/HIP SCREW RIGHT HIP (Right) Patient reports pain as moderate.  Has been up to a chair.  Family at the bedside.  Acute blood loss anemia from her fracture and surgery. Vitals stable.  Objective: Vital signs in last 24 hours: Temp:  [97.7 F (36.5 C)-100.2 F (37.9 C)] 98 F (36.7 C) (01/23 1411) Pulse Rate:  [70-88] 72 (01/23 1411) Resp:  [16-18] 18 (01/23 1411) BP: (91-114)/(47-88) 91/55 (01/23 1411) SpO2:  [91 %-99 %] 92 % (01/23 1411) Weight:  [115 lb 12.8 oz (52.5 kg)] 115 lb 12.8 oz (52.5 kg) (01/23 1040)  Intake/Output from previous day: 01/22 0701 - 01/23 0700 In: 1620 [P.O.:60; I.V.:1460; IV Piggyback:100] Out: 850 [Urine:800; Blood:50] Intake/Output this shift: Total I/O In: 120 [P.O.:120] Out: -    Recent Labs  11/19/16 0017 11/20/16 0428  HGB 11.1* 9.0*    Recent Labs  11/19/16 0017 11/20/16 0428  WBC 9.7 8.0  RBC 3.79* 3.07*  HCT 33.8* 28.0*  PLT 165 135*    Recent Labs  11/19/16 0017 11/20/16 0428  NA 139 135  K 3.6 3.4*  CL 107 106  CO2 23 24  BUN 14 8  CREATININE 0.69 0.60  GLUCOSE 128* 107*  CALCIUM 8.9 8.1*   No results for input(s): LABPT, INR in the last 72 hours.  Sensation intact distally Intact pulses distally Dorsiflexion/Plantar flexion intact Incision: scant drainage  Assessment/Plan: 1 Day Post-Op Procedure(s) (LRB): INTRAMEDULLARY (IM) NAIL INTERTROCHANTRIC/HIP SCREW RIGHT HIP (Right) Up with therapy Discharge to SNF when medically clear. Daily aspirin  Mcarthur Rossetti 11/20/2016, 2:59 PM

## 2016-11-21 DIAGNOSIS — F0390 Unspecified dementia without behavioral disturbance: Secondary | ICD-10-CM

## 2016-11-21 DIAGNOSIS — D649 Anemia, unspecified: Secondary | ICD-10-CM

## 2016-11-21 DIAGNOSIS — E039 Hypothyroidism, unspecified: Secondary | ICD-10-CM

## 2016-11-21 LAB — BASIC METABOLIC PANEL
Anion gap: 8 (ref 5–15)
BUN: 8 mg/dL (ref 6–20)
CO2: 26 mmol/L (ref 22–32)
CREATININE: 0.61 mg/dL (ref 0.44–1.00)
Calcium: 8.2 mg/dL — ABNORMAL LOW (ref 8.9–10.3)
Chloride: 104 mmol/L (ref 101–111)
GFR calc Af Amer: >60 mL/min (ref >60)
GFR calc non Af Amer: >60 mL/min (ref >60)
GLUCOSE: 100 mg/dL — AB (ref 65–99)
POTASSIUM: 3.2 mmol/L — AB (ref 3.5–5.1)
SODIUM: 138 mmol/L (ref 135–145)

## 2016-11-21 LAB — CBC
HCT: 28.1 % — ABNORMAL LOW (ref 36.0–46.0)
Hemoglobin: 8.9 g/dL — ABNORMAL LOW (ref 12.0–15.0)
MCH: 29.1 pg (ref 26.0–34.0)
MCHC: 31.7 g/dL (ref 30.0–36.0)
MCV: 91.8 fL (ref 78.0–100.0)
PLATELETS: 121 10*3/uL — AB (ref 150–400)
RBC: 3.06 MIL/uL — AB (ref 3.87–5.11)
RDW: 15.1 % (ref 11.5–15.5)
WBC: 8.7 10*3/uL (ref 4.0–10.5)

## 2016-11-21 MED ORDER — POTASSIUM CHLORIDE ER 20 MEQ PO TBCR: 20.0000 meq | EXTENDED_RELEASE_TABLET | Freq: Every day | ORAL | Status: DC

## 2016-11-21 MED ORDER — CEPHALEXIN 500 MG PO CAPS: 500.0000 mg | ORAL_CAPSULE | Freq: Two times a day (BID) | ORAL | 0 refills | Status: AC

## 2016-11-21 MED ORDER — POTASSIUM CHLORIDE CRYS ER 20 MEQ PO TBCR
40.0000 meq | EXTENDED_RELEASE_TABLET | Freq: Once | ORAL | Status: AC
  Administered 2016-11-21: 40 meq via ORAL
  Filled 2016-11-21: qty 2

## 2016-11-21 NOTE — Progress Notes (Signed)
Patient ID: Emily Bautista, female   DOB: March 31, 1922, 83 y.o.   MRN: [id number] No acute changes.  Right hip stable.  Changed dressing at the bedside.  Can return to skilled nursing from Ortho standpoint.

## 2016-11-21 NOTE — NC FL2 (Signed)
[place] MEDICAID FL2 LEVEL OF CARE SCREENING TOOL     IDENTIFICATION  Patient Name: Emily Bautista Birthdate: Jan 14, 1922 Sex: female Admission Date (Current Location): 11/18/2016  [facility] and [place] Number:  Herbalist and Address:  [facility], [place] [address] [place], [place] [address]      Provider Number: [id number]  Attending Physician Name and Address:  Kinnie Feil, MD;Gag*  Relative Name and Phone Number:       Current Level of Care: Hospital Recommended Level of Care: [place] Prior Approval Number:    Date Approved/Denied:   PASRR Number: [id number]  Discharge Plan: SNF    Current Diagnoses: Patient Active Problem List   Diagnosis Date Noted  . Protein-calorie malnutrition, severe 11/20/2016  . Acute cystitis with hematuria   . Closed nondisplaced intertrochanteric fracture of right femur ([place]) 11/19/2016  . Normocytic anemia 11/19/2016  . Anemia due to blood loss, acute 07/11/2012  . Hip fracture, left ([place]) 07/09/2012  . Dementia 07/09/2012  . COPD (chronic obstructive pulmonary disease) ([place]) 07/09/2012  . UTI (lower urinary tract infection) 07/09/2012  . Constipation 07/09/2012  . Hypothyroidism   . TIA (transient ischemic attack)   . Hepatitis B infection   . Compression fracture of lumbar vertebra ([place])   . Breast cancer ([place])   . Hyperlipemia   . Osteoporosis 11/26/2011  . Closed left humeral fracture 11/26/2011  . Falls frequently 11/26/2011  . Abnormal CT scan of head 11/26/2011  . Compression fracture 09/02/2011  . Hypothyroid 09/02/2011  . Osteoporosis with pathological fracture 09/02/2011  . Tobacco abuse 09/02/2011  . Asymptomatic bacteriuria 09/02/2011  . Hyperlipidemia 09/02/2011    Orientation RESPIRATION BLADDER Height & Weight     Self  Normal Incontinent Weight: 115 lb 12.8 oz (52.5 kg) (Obtained from bedscale) Height:  5\' 2"  (157.5 cm) (From 11/25/11 encounter)   BEHAVIORAL SYMPTOMS/MOOD NEUROLOGICAL BOWEL NUTRITION STATUS      Incontinent Diet (Regular Diet, Thin Liquids)  AMBULATORY STATUS COMMUNICATION OF NEEDS Skin   Limited Assist Verbally Normal                       Personal Care Assistance Level of Assistance  Bathing, Dressing, Feeding Bathing Assistance: Limited assistance Feeding assistance: Independent Dressing Assistance: Limited assistance     Functional Limitations Info  Sight, Hearing, Speech Sight Info: Adequate Hearing Info: Adequate Speech Info: Adequate    SPECIAL CARE FACTORS FREQUENCY  PT (By licensed PT), OT (By licensed OT)     PT Frequency: 5 OT Frequency: 5            Contractures Contractures Info: Not present    Additional Factors Info  Code Status, Allergies Code Status Info: DNR Allergies Info: No known allergies           Current Medications (11/21/2016):  This is the current hospital active medication list Current Facility-Administered Medications  Medication Dose Route Frequency Provider Last Rate Last Dose  . acetaminophen (TYLENOL) tablet 650 mg  650 mg Oral Q6H PRN Mcarthur Rossetti, MD   650 mg at 11/20/16 0435   Or  . acetaminophen (TYLENOL) suppository 650 mg  650 mg Rectal Q6H PRN Mcarthur Rossetti, MD      . aspirin EC tablet 325 mg  325 mg Oral Q breakfast Mcarthur Rossetti, MD   325 mg at 11/21/16 [id number]  . bisacodyl (DULCOLAX) suppository 10 mg  10 mg Rectal Daily  PRN Edwin Dada, MD      . cefTRIAXone (ROCEPHIN) 1 g in dextrose 5 % 50 mL IVPB  1 g Intravenous Q24H Kinnie Feil, MD 100 mL/hr at 11/20/16 1209 1 g at 11/20/16 1209  . docusate sodium (COLACE) capsule 100 mg  100 mg Oral BID Edwin Dada, MD   100 mg at 11/21/16 0842  . feeding supplement (BOOST / RESOURCE BREEZE) liquid 1 Container  1 Container Oral TID BM Domenick Bookbinder, RD   1 Container at 11/20/16 1954  . HYDROcodone-acetaminophen (NORCO/VICODIN) 5-325 MG per tablet  1-2 tablet  1-2 tablet Oral Q6H PRN Mcarthur Rossetti, MD   1 tablet at 11/21/16 [phone]  . levothyroxine (SYNTHROID, LEVOTHROID) tablet 75 mcg  75 mcg Oral QAC breakfast Edwin Dada, MD   75 mcg at 11/21/16 [phone]  . menthol-cetylpyridinium (CEPACOL) lozenge 3 mg  1 lozenge Oral PRN Mcarthur Rossetti, MD       Or  . phenol (CHLORASEPTIC) mouth spray 1 spray  1 spray Mouth/Throat PRN Mcarthur Rossetti, MD      . metoCLOPramide (REGLAN) tablet 5-10 mg  5-10 mg Oral Q8H PRN Mcarthur Rossetti, MD       Or  . metoCLOPramide (REGLAN) injection 5-10 mg  5-10 mg Intravenous Q8H PRN Mcarthur Rossetti, MD      . morphine 2 MG/ML injection 1 mg  1 mg Intravenous Q2H PRN Kinnie Feil, MD   1 mg at 11/20/16 2303  . ondansetron (ZOFRAN) tablet 4 mg  4 mg Oral Q6H PRN Mcarthur Rossetti, MD       Or  . ondansetron [facility]) injection 4 mg  4 mg Intravenous Q6H PRN Mcarthur Rossetti, MD      . polyethylene glycol (MIRALAX / GLYCOLAX) packet 17 g  17 g Oral Daily PRN Edwin Dada, MD         Discharge Medications: Please see discharge summary for a list of discharge medications.  Relevant Imaging Results:  Relevant Lab Results:   Additional Information SSN:  [id number]  Darden Dates, LCSW

## 2016-11-21 NOTE — Progress Notes (Addendum)
Report given to Gibson Ramp at [facility]. Reported that patient foley was discontinued today but no urine output noted yet at this time. RN stated they have Md to manage  in there facility.

## 2016-11-21 NOTE — Progress Notes (Signed)
Discharge to [facility] via [place]. Not in any distress. Son made aware of discharge.

## 2016-11-21 NOTE — Clinical Social Work Note (Signed)
Clinical Social Work Assessment  Patient Details  Name: Emily Bautista MRN: [id number] Date of Birth: [birth year]  Date of referral:  11/21/16               Reason for consult:  Facility Placement                Permission sought to share information with:  Family Supports Permission granted to share information::  Yes, Verbal Permission Granted  Name::     Priscella Donna  Relationship::  son  Contact Information:  [phone]  Housing/Transportation Living arrangements for the past 2 months:  [place] of Information:  Adult Children Patient Interpreter Needed:  None Criminal Activity/Legal Involvement Pertinent to Current Situation/Hospitalization:  No - Comment as needed Significant Relationships:  Adult Children Lives with:  Facility Resident Do you feel safe going back to the place where you live?  Yes Need for family participation in patient care:  Yes (Comment)  Care giving concerns:  No care giving concerns identified.   Social Worker assessment / plan:  CSW met with pt's son to address consult. Pt is only oriented to self and is alert. CSW introduced herself and explained role of social work. CSW also explained the process of returning to SNF. Pt is a LTC resident of [facility] and is able to return to facility. Pt is ready for discharge today. Facility has received discharge information. RN will call report. PTAR will provide transportation to facility. Pt's son is in agreement with discharge plan. CSW is signing off as no further needs identified  Employment status:  Retired Forensic scientist:  Medicare PT Recommendations:  [place] / Referral to community resources:  [place]  Patient/Family's Response to care:  Pt's son was Patent attorney of CSW support.   Patient/Family's Understanding of and Emotional Response to Diagnosis, Current Treatment, and Prognosis:  Pt's son understands that pt is to  return to facility for continued care.   Emotional Assessment Appearance:  Appears stated age Attitude/Demeanor/Rapport:  Other (appropriate) Affect (typically observed):  Calm Orientation:  Oriented to Self Alcohol / Substance use:  Not Applicable Psych involvement (Current and /or in the community):  No (Comment)  Discharge Needs  Concerns to be addressed:  No discharge needs identified Readmission within the last 30 days:  No Current discharge risk:  Chronically ill Barriers to Discharge:  No Barriers Identified   Darden Dates, LCSW 11/21/2016, 11:57 AM

## 2016-11-21 NOTE — Progress Notes (Signed)
No urine output noted till this time. Md made aware and stated that its ok to discharge patient with no urine output. Md stated SNF can manage to insert foley if unable to urinate.

## 2016-11-21 NOTE — Discharge Summary (Signed)
Physician Discharge Summary  Emily Bautista [id number] DOB: 1922/03/12 DOA: 11/18/2016  PCP: Limmie Patricia, MD  Admit date: 11/18/2016 Discharge date: 11/21/2016  Time spent: 25* minutes  Recommendations for Outpatient Follow-up:   Follow up Orthopedics in 2 weeks Check BMP in 3 days, on 11/24/16  Discharge Diagnoses:  Principal Problem:   Closed nondisplaced intertrochanteric fracture of right femur ([place]) Active Problems:   Hypothyroidism   Dementia   COPD (chronic obstructive pulmonary disease) ([place])   Normocytic anemia   Protein-calorie malnutrition, severe   Acute cystitis with hematuria   Discharge Condition: Stable  Diet recommendation: Regular diet  Filed Weights   11/20/16 1040  Weight: 52.5 kg (115 lb 12.8 oz)    History of present illness:  83 y.o.femalewith a past medical history significant for dementia, COPD, and hypothyroidismwho presents with hip pain after a fall. Found to have right hip fracture.   Hospital Course:   Recent  Fall/ Right hip fracture. Underwent surgery for pain control, per orthopedics on 1/22. -mild post op anemia. No s/s of acute bleeding. Hb today is 8.9. Ortho has cleared for discharge.  ? UTI- patient had mild fever and abnormal urine, she was started on IV ceftriaxone. No urine culture was obtained, will start her on Keflex 500 mg po bid for 5 more days, stop on 11/26/16.  Dementia- no behavior disturbance, stable.  Hypothyroidism- continue Levothyroxine.  Hypokalemia- potassium is 3.2 today, will give one dose of Kdur 40 eq before discharge. Will start Kdur 20 meq po daily x 3 days.Will need to check BMP in  3  days.  Procedures:  Right hip repair  Consultations:  Orthopedics  Discharge Exam: Vitals:   11/20/16 1900 11/21/16 0500  BP: 100/61 (!) 101/55  Pulse: 78 77  Resp: 18 19  Temp: 98 F (36.7 C) 98 F (36.7 C)    General: Appears in no acute distress Cardiovascular: RRR, S1S2 Respiratory: Clear  to auscultation bilaterally  Discharge Instructions   Discharge Instructions    Diet - low sodium heart healthy    Complete by:  As directed    Full weight bearing    Complete by:  As directed    Increase activity slowly    Complete by:  As directed      Current Discharge Medication List    START taking these medications   Details  aspirin EC 325 MG EC tablet Take 1 tablet (325 mg total) by mouth daily with breakfast. Qty: 30 tablet, Refills: 0    cephALEXin (KEFLEX) 500 MG capsule Take 1 capsule (500 mg total) by mouth 2 (two) times daily. Qty: 10 capsule, Refills: 0      CONTINUE these medications which have NOT CHANGED   Details  acetaminophen (TYLENOL) 500 MG tablet Take 500 mg by mouth every 6 (six) hours as needed for mild pain.    acetaminophen-codeine (TYLENOL #3) 300-30 MG tablet Take 1 tablet by mouth every 6 (six) hours as needed for moderate pain.    cholecalciferol (VITAMIN D) 1000 UNITS tablet Take 1,000 Units by mouth daily.    guaiFENesin-dextromethorphan (ROBITUSSIN DM) 100-10 MG/5ML syrup Take 15 mLs by mouth every 4 (four) hours as needed for cough.    levothyroxine (SYNTHROID, LEVOTHROID) 75 MCG tablet Take 75 mcg by mouth daily before breakfast.    loperamide (IMODIUM A-D) 2 MG tablet Take 2 mg by mouth 4 (four) times daily as needed for diarrhea or loose stools.    magnesium hydroxide (MILK OF MAGNESIA)  400 MG/5ML suspension Take 30 mLs by mouth daily as needed for mild constipation.    polyethylene glycol (MIRALAX / GLYCOLAX) packet Take 17 g by mouth daily as needed. For constipation    senna-docusate (SENOKOT-S) 8.6-50 MG per tablet Take 2 tablets by mouth daily.     traZODone (DESYREL) 50 MG tablet Take 50 mg by mouth at bedtime.    Potassium chloride 20 meq po daily for 3 days     No Known Allergies Follow-up Information    Mcarthur Rossetti, MD. Schedule an appointment as soon as possible for a visit in 2 week(s).   Specialty:   Orthopedic Surgery Contact information: [place] [address] [phone]            The results of significant diagnostics from this hospitalization (including imaging, microbiology, ancillary and laboratory) are listed below for reference.    Significant Diagnostic Studies: Dg Chest 1 View  Result Date: 11/19/2016 CLINICAL DATA:  83 year old female with fall and right hip fracture. EXAM: CHEST 1 VIEW COMPARISON:  Chest radiograph dated 07/09/2012 FINDINGS: There is mild emphysematous changes of the lungs with mild chronic interstitial coarsening. There is no focal consolidation, pleural effusion, or pneumothorax. The cardiac silhouette is within normal limits. The aorta is tortuous. There is atherosclerotic calcification of the aorta. Right axillary surgical clips noted. There is osteopenia with extensive degenerative changes of the spine. Old healed left posterior rib fractures. Multiple lumbar compression deformity as well as vertebroplasty changes noted. A 1 cm round calcific density noted in the left upper abdomen. IMPRESSION: No acute cardiopulmonary process. Atherosclerotic calcification of the aorta with aortic tortuosity. Degenerative changes, osteopenia, and multilevel compression deformity of the lumbar spine. Electronically Signed   By: Anner Crete M.D.   On: 11/19/2016 01:49   Dg C-arm 1-60 Min  Result Date: 11/19/2016 CLINICAL DATA:  Right femoral fracture EXAM: OPERATIVE RIGHT HIP WITH PELVIS; DG C-ARM 61-120 MIN COMPARISON:  None. FLUOROSCOPY TIME:  Radiation Exposure Index (as provided by the fluoroscopic device): Not available If the device does not provide the exposure index: Fluoroscopy Time:  1 minutes 29 seconds Number of Acquired Images:  4 FINDINGS: Femoral medullary rod with fixation screws are seen. Fracture fragments are in near anatomic alignment. IMPRESSION: ORIF of proximal right femoral fracture. Electronically Signed   By: Inez Catalina M.D.   On: 11/19/2016 19:35   Dg Hip Operative Unilat With Pelvis Right  Result Date: 11/19/2016 CLINICAL DATA:  Right femoral fracture EXAM: OPERATIVE RIGHT HIP WITH PELVIS; DG C-ARM 61-120 MIN COMPARISON:  None. FLUOROSCOPY TIME:  Radiation Exposure Index (as provided by the fluoroscopic device): Not available If the device does not provide the exposure index: Fluoroscopy Time:  1 minutes 29 seconds Number of Acquired Images:  4 FINDINGS: Femoral medullary rod with fixation screws are seen. Fracture fragments are in near anatomic alignment. IMPRESSION: ORIF of proximal right femoral fracture. Electronically Signed   By: Inez Catalina M.D.   On: 11/19/2016 19:35   Dg Hip Unilat W Or Wo Pelvis 2-3 Views Right  Result Date: 11/19/2016 CLINICAL DATA:  83 y/o  F; right hip fracture. EXAM: DG HIP (WITH OR WITHOUT PELVIS) 2-3V RIGHT COMPARISON:  None. FINDINGS: Mildly comminuted intertrochanteric fracture of the right proximal femur with apex anterior and coxa vera angulation. The femoral head is well seated in the acetabulum. Partially visualized left femoral nail and 2 interlocking threaded screws without appreciable hardware related complication or fracture. No pelvic  fracture is identified. Vascular calcifications. Extensive degenerative changes of lower lumbar spine. IMPRESSION: Mildly comminuted intertrochanteric fracture of the right proximal femur with apex anterior and coxa vera angulation. The femoral head is well seated in the acetabulum. Electronically Signed   By: Kristine Garbe M.D.   On: 11/19/2016 01:49    Microbiology: Recent Results (from the past 240 hour(s))  MRSA PCR Screening     Status: None   Collection Time: 11/19/16  4:35 PM  Result Value Ref Range Status   MRSA by PCR NEGATIVE NEGATIVE Final    Comment:        The GeneXpert MRSA Assay (FDA approved for NASAL specimens only), is one component of a comprehensive MRSA colonization surveillance program. It is  not intended to diagnose MRSA infection nor to guide or monitor treatment for MRSA infections.      Labs: Basic Metabolic Panel:  Recent Labs Lab 11/19/16 0017 11/20/16 0428 11/21/16 0309  NA 139 135 138  K 3.6 3.4* 3.2*  CL 107 106 104  CO2 23 24 26   GLUCOSE 128* 107* 100*  BUN 14 8 8   CREATININE 0.69 0.60 0.61  CALCIUM 8.9 8.1* 8.2*   Liver Function Tests:  Recent Labs Lab 11/19/16 0017  AST 29  ALT 16  ALKPHOS 81  BILITOT 0.7  PROT 6.3*  ALBUMIN 3.2*   No results for input(s): LIPASE, AMYLASE in the last 168 hours. No results for input(s): AMMONIA in the last 168 hours. CBC:  Recent Labs Lab 11/19/16 0017 11/20/16 0428 11/21/16 0309  WBC 9.7 8.0 8.7  NEUTROABS 8.3*  --   --   HGB 11.1* 9.0* 8.9*  HCT 33.8* 28.0* 28.1*  MCV 89.2 91.2 91.8  PLT 165 135* 121*       Signed:  Nita Whitmire S MD.  [facility] 11/21/2016, 8:01 AM

## 2016-11-22 ENCOUNTER — Telehealth (INDEPENDENT_AMBULATORY_CARE_PROVIDER_SITE_OTHER): Payer: Self-pay | Admitting: Orthopaedic Surgery

## 2016-11-22 NOTE — Telephone Encounter (Signed)
Katie RN from [facility] called about patients dressing. Patient is status post IM nail right hip. Patient has removed dressing herself. They are going to reapply dressing advised this is fine.

## 2016-12-03 ENCOUNTER — Ambulatory Visit (INDEPENDENT_AMBULATORY_CARE_PROVIDER_SITE_OTHER): Payer: No Typology Code available for payment source

## 2016-12-03 ENCOUNTER — Encounter (INDEPENDENT_AMBULATORY_CARE_PROVIDER_SITE_OTHER): Payer: Self-pay | Admitting: Physician Assistant

## 2016-12-03 ENCOUNTER — Ambulatory Visit (INDEPENDENT_AMBULATORY_CARE_PROVIDER_SITE_OTHER): Payer: Medicare Other | Admitting: Physician Assistant

## 2016-12-03 DIAGNOSIS — S72144A Nondisplaced intertrochanteric fracture of right femur, initial encounter for closed fracture: Secondary | ICD-10-CM

## 2016-12-03 NOTE — Progress Notes (Signed)
Office Visit Note   Patient: Emily Bautista           Date of Birth: 10-28-1922           MRN: [id number] Visit Date: 12/03/2016              Requested by: Lorne Skeens, MD [place], [place] [address] PCP: Limmie Patricia, MD   Assessment & Plan: Visit Diagnoses:  1. Closed nondisplaced intertrochanteric fracture of right femur, initial encounter ([place])     Plan:Folloiw up in 1 month AP and lateral views of the right femur at that time. Weightbearing as tolerated on the right hip. Staples were removed today Steri-Strips applied she get a shower.and get her incisions wet.  Follow-Up Instructions: Return in about 4 weeks (around 12/31/2016) for Radiographs, post op.   Orders:  Orders Placed This Encounter  Procedures  . XR FEMUR, MIN 2 VIEWS RIGHT   No orders of the defined types were placed in this encounter.     Procedures: No procedures performed   Clinical Data: No additional findings.   Subjective: Chief Complaint  Patient presents with  . Right Hip - Routine Post Op  . Follow-up    HPI 2 weeks status post IM nailing of a right hip intertrochanteric fracture. Still using a Hoyer lift to be transferred  presents today in a wheelchair. Review of Systems   Objective: Vital Signs: There were no vitals taken for this visit.  Physical Exam  Constitutional: She is oriented to person, place, and time. She appears well-developed and well-nourished. No distress.  Cardiovascular: Intact distal pulses.   Neurological: She is alert and oriented to person, place, and time.  Psychiatric: She has a normal mood and affect.    Ortho Exam Right hip incisions healing well no signs of infection. Calf supple she has a large area of ecchymosis and popliteal region and has some tenderness in this area. Right dorsal pedal pulses intact dorsiflexion and plantarflexion left the ankle is intact. Specialty Comments:  No specialty comments  available.  Imaging: Xr Femur, Min 2 Views Right  Result Date: 12/03/2016 Multiple Views in the right femur:IM nailing of an intertrochanteric fracture without any evidence of hardware failure. Hips well located. No change in overall position alignment of the fracture.    PMFS History: Patient Active Problem List   Diagnosis Date Noted  . Protein-calorie malnutrition, severe 11/20/2016  . Acute cystitis with hematuria   . Closed nondisplaced intertrochanteric fracture of right femur ([place]) 11/19/2016  . Normocytic anemia 11/19/2016  . Anemia due to blood loss, acute 07/11/2012  . Hip fracture, left ([place]) 07/09/2012  . Dementia 07/09/2012  . COPD (chronic obstructive pulmonary disease) ([place]) 07/09/2012  . UTI (lower urinary tract infection) 07/09/2012  . Constipation 07/09/2012  . Hypothyroidism   . TIA (transient ischemic attack)   . Hepatitis B infection   . Compression fracture of lumbar vertebra ([place])   . Breast cancer ([place])   . Hyperlipemia   . Osteoporosis 11/26/2011  . Closed left humeral fracture 11/26/2011  . Falls frequently 11/26/2011  . Abnormal CT scan of head 11/26/2011  . Compression fracture 09/02/2011  . Hypothyroid 09/02/2011  . Osteoporosis with pathological fracture 09/02/2011  . Tobacco abuse 09/02/2011  . Asymptomatic bacteriuria 09/02/2011  . Hyperlipidemia 09/02/2011   Past Medical History:  Diagnosis Date  . Breast cancer ([place])   . Compression fracture of lumbar vertebra ([place])   . Dementia   . Hepatitis  B infection   . Hyperlipemia   . Hypothyroidism   . Thyroid disease   . TIA (transient ischemic attack)     No family history on file.  Past Surgical History:  Procedure Laterality Date  . APPENDECTOMY    . FEMUR IM NAIL  07/09/2012   Procedure: INTRAMEDULLARY (IM) NAIL FEMORAL;  Surgeon: Augustin Schooling, MD;  Location: [facility];  Service: Orthopedics;  Laterality: Left;  . INTRAMEDULLARY (IM) NAIL INTERTROCHANTERIC Right 11/19/2016    Procedure: INTRAMEDULLARY (IM) NAIL INTERTROCHANTRIC/HIP SCREW RIGHT HIP;  Surgeon: Mcarthur Rossetti, MD;  Location: [facility];  Service: Orthopedics;  Laterality: Right;  . MASTECTOMY     right sided  . WRIST SURGERY     Social History   Occupational History  . Not on file.   Social History Main Topics  . Smoking status: Former Smoker    Packs/day: 0.00    Years: 60.00  . Smokeless tobacco: Former Systems developer     Comment: current uses smokless cigarettes  . Alcohol use No  . Drug use: No  . Sexual activity: No

## 2016-12-31 ENCOUNTER — Ambulatory Visit (INDEPENDENT_AMBULATORY_CARE_PROVIDER_SITE_OTHER): Payer: No Typology Code available for payment source

## 2016-12-31 ENCOUNTER — Encounter (INDEPENDENT_AMBULATORY_CARE_PROVIDER_SITE_OTHER): Payer: Self-pay | Admitting: Physician Assistant

## 2016-12-31 ENCOUNTER — Ambulatory Visit (INDEPENDENT_AMBULATORY_CARE_PROVIDER_SITE_OTHER): Payer: Medicare Other | Admitting: Physician Assistant

## 2016-12-31 DIAGNOSIS — S72144D Nondisplaced intertrochanteric fracture of right femur, subsequent encounter for closed fracture with routine healing: Secondary | ICD-10-CM

## 2016-12-31 NOTE — Progress Notes (Signed)
Office Visit Note   Patient: Emily Bautista           Date of Birth: [birth year]           MRN: [id number] Visit Date: 12/31/2016              Requested by: Lorne Skeens, MD [place], [place] [address] PCP: Limmie Patricia, MD   Assessment & Plan: Visit Diagnoses:  1. Closed nondisplaced intertrochanteric fracture of right femur with routine healing, subsequent encounter     Plan: Continue to work with physical therapy on gait balance range of motion of the hip also work on IT band stretching. Follow-up with Dr. Ninfa Linden on an as-needed basis.  Follow-Up Instructions: Return if symptoms worsen or fail to improve.   Orders:  Orders Placed This Encounter  Procedures  . XR FEMUR, MIN 2 VIEWS RIGHT   No orders of the defined types were placed in this encounter.     Procedures: No procedures performed   Clinical Data: No additional findings.   Subjective: Chief Complaint  Patient presents with  . Right Leg - Routine Post Op    Patient returns for four week follow up right femur. She is status post IM nailing right hip intertroch fracture on 11/19/2016. She is 6 weeks post op. She states that she is doing well. She denies any pain. She is [facility]. She is also having physical therapy which she states is going well.     Review of Systems   Objective: Vital Signs: There were no vitals taken for this visit.  Physical Exam  Ortho Exam The hip good range of motion without pain tenderness over the right trochanteric region. Calf supple nontender Specialty Comments:  No specialty comments available.  Imaging: Xr Femur, Min 2 Views Right  Result Date: 12/31/2016 Right hip multiple views: No hardware failure, loss of position overall alignment. Fracture is well-healed.    PMFS History: Patient Active Problem List   Diagnosis Date Noted  . Protein-calorie malnutrition, severe 11/20/2016  . Acute  cystitis with hematuria   . Closed nondisplaced intertrochanteric fracture of right femur () 11/19/2016  . Normocytic anemia 11/19/2016  . Anemia due to blood loss, acute 07/11/2012  . Hip fracture, left ([place]) 07/09/2012  . Dementia 07/09/2012  . COPD (chronic obstructive pulmonary disease) ([place]) 07/09/2012  . UTI (lower urinary tract infection) 07/09/2012  . Constipation 07/09/2012  . Hypothyroidism   . TIA (transient ischemic attack)   . Hepatitis B infection   . Compression fracture of lumbar vertebra ([place])   . Breast cancer ([place])   . Hyperlipemia   . Osteoporosis 11/26/2011  . Closed left humeral fracture 11/26/2011  . Falls frequently 11/26/2011  . Abnormal CT scan of head 11/26/2011  . Compression fracture 09/02/2011  . Hypothyroid 09/02/2011  . Osteoporosis with pathological fracture 09/02/2011  . Tobacco abuse 09/02/2011  . Asymptomatic bacteriuria 09/02/2011  . Hyperlipidemia 09/02/2011   Past Medical History:  Diagnosis Date  . Breast cancer ([place])   . Compression fracture of lumbar vertebra ([place])   . Dementia   . Hepatitis B infection   . Hyperlipemia   . Hypothyroidism   . Thyroid disease   . TIA (transient ischemic attack)     No family history on file.  Past Surgical History:  Procedure Laterality Date  . APPENDECTOMY    . FEMUR IM NAIL  07/09/2012   Procedure: INTRAMEDULLARY (IM) NAIL FEMORAL;  Surgeon: Augustin Schooling, MD;  Location: [facility];  Service: Orthopedics;  Laterality: Left;  . INTRAMEDULLARY (IM) NAIL INTERTROCHANTERIC Right 11/19/2016   Procedure: INTRAMEDULLARY (IM) NAIL INTERTROCHANTRIC/HIP SCREW RIGHT HIP;  Surgeon: Mcarthur Rossetti, MD;  Location: [facility];  Service: Orthopedics;  Laterality: Right;  . MASTECTOMY     right sided  . WRIST SURGERY     Social History   Occupational History  . Not on file.   Social History Main Topics  . Smoking status: Former Smoker    Packs/day: 0.00    Years: 60.00  . Smokeless tobacco: Former  Systems developer     Comment: current uses smokless cigarettes  . Alcohol use No  . Drug use: No  . Sexual activity: No

## 2017-04-01 NOTE — Addendum Note (Signed)
Addendum  created 04/01/17 1017 by Myrtie Soman, MD   Sign clinical note

## 2018-12-07 ENCOUNTER — Encounter (HOSPITAL_COMMUNITY): Payer: Self-pay | Admitting: Emergency Medicine

## 2018-12-07 ENCOUNTER — Emergency Department (HOSPITAL_COMMUNITY): Payer: Medicare Other

## 2018-12-07 ENCOUNTER — Other Ambulatory Visit: Payer: Self-pay

## 2018-12-07 ENCOUNTER — Inpatient Hospital Stay (HOSPITAL_COMMUNITY)
Admission: EM | Admit: 2018-12-07 | Discharge: 2018-12-09 | DRG: 871 | Disposition: A | Discharge status: Hospice | Payer: Medicare Other | Attending: Internal Medicine | Admitting: Internal Medicine

## 2018-12-07 DIAGNOSIS — I959 Hypotension, unspecified: Secondary | ICD-10-CM | POA: Diagnosis present

## 2018-12-07 DIAGNOSIS — J189 Pneumonia, unspecified organism: Secondary | ICD-10-CM | POA: Diagnosis present

## 2018-12-07 DIAGNOSIS — F0390 Unspecified dementia without behavioral disturbance: Secondary | ICD-10-CM | POA: Diagnosis present

## 2018-12-07 DIAGNOSIS — R402343 Coma scale, best motor response, flexion withdrawal, at hospital admission: Secondary | ICD-10-CM | POA: Diagnosis present

## 2018-12-07 DIAGNOSIS — J9621 Acute and chronic respiratory failure with hypoxia: Secondary | ICD-10-CM | POA: Diagnosis present

## 2018-12-07 DIAGNOSIS — R402433 Glasgow coma scale score 3-8, at hospital admission: Secondary | ICD-10-CM

## 2018-12-07 DIAGNOSIS — R6520 Severe sepsis without septic shock: Secondary | ICD-10-CM | POA: Diagnosis present

## 2018-12-07 DIAGNOSIS — A419 Sepsis, unspecified organism: Secondary | ICD-10-CM | POA: Diagnosis present

## 2018-12-07 DIAGNOSIS — Z66 Do not resuscitate: Secondary | ICD-10-CM | POA: Diagnosis present

## 2018-12-07 DIAGNOSIS — E785 Hyperlipidemia, unspecified: Secondary | ICD-10-CM | POA: Diagnosis present

## 2018-12-07 DIAGNOSIS — Z7982 Long term (current) use of aspirin: Secondary | ICD-10-CM

## 2018-12-07 DIAGNOSIS — N390 Urinary tract infection, site not specified: Secondary | ICD-10-CM

## 2018-12-07 DIAGNOSIS — R319 Hematuria, unspecified: Secondary | ICD-10-CM | POA: Diagnosis present

## 2018-12-07 DIAGNOSIS — B1910 Unspecified viral hepatitis B without hepatic coma: Secondary | ICD-10-CM | POA: Diagnosis present

## 2018-12-07 DIAGNOSIS — Z79899 Other long term (current) drug therapy: Secondary | ICD-10-CM

## 2018-12-07 DIAGNOSIS — Z9010 Acquired absence of unspecified breast and nipple: Secondary | ICD-10-CM

## 2018-12-07 DIAGNOSIS — E039 Hypothyroidism, unspecified: Secondary | ICD-10-CM | POA: Diagnosis present

## 2018-12-07 DIAGNOSIS — J440 Chronic obstructive pulmonary disease with acute lower respiratory infection: Secondary | ICD-10-CM | POA: Diagnosis present

## 2018-12-07 DIAGNOSIS — R402223 Coma scale, best verbal response, incomprehensible words, at hospital admission: Secondary | ICD-10-CM | POA: Diagnosis present

## 2018-12-07 DIAGNOSIS — G9340 Encephalopathy, unspecified: Secondary | ICD-10-CM

## 2018-12-07 DIAGNOSIS — E876 Hypokalemia: Secondary | ICD-10-CM | POA: Diagnosis present

## 2018-12-07 DIAGNOSIS — Z515 Encounter for palliative care: Secondary | ICD-10-CM | POA: Diagnosis present

## 2018-12-07 DIAGNOSIS — E079 Disorder of thyroid, unspecified: Secondary | ICD-10-CM | POA: Diagnosis present

## 2018-12-07 DIAGNOSIS — J441 Chronic obstructive pulmonary disease with (acute) exacerbation: Secondary | ICD-10-CM

## 2018-12-07 DIAGNOSIS — Z8673 Personal history of transient ischemic attack (TIA), and cerebral infarction without residual deficits: Secondary | ICD-10-CM

## 2018-12-07 DIAGNOSIS — Z853 Personal history of malignant neoplasm of breast: Secondary | ICD-10-CM

## 2018-12-07 DIAGNOSIS — G9341 Metabolic encephalopathy: Secondary | ICD-10-CM | POA: Diagnosis present

## 2018-12-07 DIAGNOSIS — R600 Localized edema: Secondary | ICD-10-CM | POA: Diagnosis present

## 2018-12-07 DIAGNOSIS — R402133 Coma scale, eyes open, to sound, at hospital admission: Secondary | ICD-10-CM | POA: Diagnosis present

## 2018-12-07 DIAGNOSIS — Z79890 Hormone replacement therapy (postmenopausal): Secondary | ICD-10-CM

## 2018-12-07 DIAGNOSIS — Z87891 Personal history of nicotine dependence: Secondary | ICD-10-CM

## 2018-12-07 HISTORY — DX: Chronic obstructive pulmonary disease, unspecified: J44.9

## 2018-12-07 LAB — URINALYSIS, ROUTINE W REFLEX MICROSCOPIC
BILIRUBIN URINE: NEGATIVE
Glucose, UA: NEGATIVE mg/dL
Hgb urine dipstick: NEGATIVE
KETONES UR: NEGATIVE mg/dL
LEUKOCYTES UA: NEGATIVE
Nitrite: POSITIVE — AB
Protein, ur: NEGATIVE mg/dL
Specific Gravity, Urine: 1.023 (ref 1.005–1.030)
pH: 5 (ref 5.0–8.0)

## 2018-12-07 LAB — CBC WITH DIFFERENTIAL/PLATELET
ABS IMMATURE GRANULOCYTES: 0.03 10*3/uL (ref 0.00–0.07)
BASOS ABS: 0 10*3/uL (ref 0.0–0.1)
Basophils Relative: 1 %
EOS ABS: 0 10*3/uL (ref 0.0–0.5)
Eosinophils Relative: 1 %
HCT: 24.7 % — ABNORMAL LOW (ref 36.0–46.0)
Hemoglobin: 7.2 g/dL — ABNORMAL LOW (ref 12.0–15.0)
IMMATURE GRANULOCYTES: 1 %
LYMPHS ABS: 0.6 10*3/uL — AB (ref 0.7–4.0)
LYMPHS PCT: 11 %
MCH: 25.5 pg — ABNORMAL LOW (ref 26.0–34.0)
MCHC: 29.1 g/dL — ABNORMAL LOW (ref 30.0–36.0)
MCV: 87.6 fL (ref 80.0–100.0)
Monocytes Absolute: 0.1 10*3/uL (ref 0.1–1.0)
Monocytes Relative: 2 %
NEUTROS ABS: 5.1 10*3/uL (ref 1.7–7.7)
NEUTROS PCT: 84 %
NRBC: 0 % (ref 0.0–0.2)
PLATELETS: 176 10*3/uL (ref 150–400)
RBC: 2.82 MIL/uL — ABNORMAL LOW (ref 3.87–5.11)
RDW: 14.7 % (ref 11.5–15.5)
WBC: 6 10*3/uL (ref 4.0–10.5)

## 2018-12-07 LAB — COMPREHENSIVE METABOLIC PANEL
ALBUMIN: 2.6 g/dL — AB (ref 3.5–5.0)
ALT: 17 U/L (ref 0–44)
AST: 31 U/L (ref 15–41)
Alkaline Phosphatase: 72 U/L (ref 38–126)
Anion gap: 10 (ref 5–15)
BILIRUBIN TOTAL: 0.5 mg/dL (ref 0.3–1.2)
BUN: 22 mg/dL (ref 8–23)
CO2: 23 mmol/L (ref 22–32)
Calcium: 8.5 mg/dL — ABNORMAL LOW (ref 8.9–10.3)
Chloride: 105 mmol/L (ref 98–111)
Creatinine, Ser: 0.54 mg/dL (ref 0.44–1.00)
GFR calc Af Amer: >60 mL/min (ref >60)
GFR calc non Af Amer: >60 mL/min (ref >60)
GLUCOSE: 125 mg/dL — AB (ref 70–99)
POTASSIUM: 3.2 mmol/L — AB (ref 3.5–5.1)
Sodium: 138 mmol/L (ref 135–145)
TOTAL PROTEIN: 5.8 g/dL — AB (ref 6.5–8.1)

## 2018-12-07 LAB — LACTIC ACID, PLASMA: LACTIC ACID, VENOUS: 1.6 mmol/L (ref 0.5–1.9)

## 2018-12-07 LAB — BRAIN NATRIURETIC PEPTIDE: B NATRIURETIC PEPTIDE 5: 1563.1 pg/mL — AB (ref 0.0–100.0)

## 2018-12-07 LAB — INFLUENZA PANEL BY PCR (TYPE A & B)
INFLAPCR: NEGATIVE
Influenza B By PCR: NEGATIVE

## 2018-12-07 MED ORDER — METHYLPREDNISOLONE SODIUM SUCC 125 MG IJ SOLR: 125.0000 mg | Freq: Once | INTRAMUSCULAR | Status: DC

## 2018-12-07 MED ORDER — SODIUM CHLORIDE 0.9 % IV SOLN
1.0000 g | Freq: Once | INTRAVENOUS | Status: AC
  Administered 2018-12-07: 1 g via INTRAVENOUS
  Filled 2018-12-07: qty 10

## 2018-12-07 MED ORDER — SODIUM CHLORIDE 0.9 % IV BOLUS
500.0000 mL | Freq: Once | INTRAVENOUS | Status: AC
  Administered 2018-12-07: 500 mL via INTRAVENOUS

## 2018-12-07 MED ORDER — LACTATED RINGERS IV BOLUS
500.0000 mL | Freq: Once | INTRAVENOUS | Status: AC
  Administered 2018-12-07: 500 mL via INTRAVENOUS

## 2018-12-07 MED ORDER — SODIUM CHLORIDE 0.9 % IV SOLN: 500.0000 mg | INTRAVENOUS | Status: DC

## 2018-12-07 MED ORDER — IPRATROPIUM-ALBUTEROL 0.5-2.5 (3) MG/3ML IN SOLN
3.0000 mL | Freq: Four times a day (QID) | RESPIRATORY_TRACT | Status: DC
  Administered 2018-12-08 (×2): 3 mL via RESPIRATORY_TRACT
  Filled 2018-12-07 (×2): qty 3

## 2018-12-07 MED ORDER — SODIUM CHLORIDE 0.9 % IV SOLN: 1.0000 g | INTRAVENOUS | Status: DC

## 2018-12-07 MED ORDER — LEVOTHYROXINE SODIUM 100 MCG/5ML IV SOLN: 37.5000 ug | Freq: Every day | INTRAVENOUS | Status: DC

## 2018-12-07 MED ORDER — ACETAMINOPHEN 650 MG RE SUPP: 650.0000 mg | Freq: Four times a day (QID) | RECTAL | Status: DC | PRN

## 2018-12-07 MED ORDER — SODIUM CHLORIDE 0.9 % IV SOLN
500.0000 mg | Freq: Once | INTRAVENOUS | Status: AC
  Administered 2018-12-07: 500 mg via INTRAVENOUS
  Filled 2018-12-07: qty 500

## 2018-12-07 MED ORDER — ACETAMINOPHEN 325 MG PO TABS: 650.0000 mg | ORAL_TABLET | Freq: Four times a day (QID) | ORAL | Status: DC | PRN

## 2018-12-07 MED ORDER — POTASSIUM CHLORIDE 10 MEQ/100ML IV SOLN
10.0000 meq | INTRAVENOUS | Status: DC
  Administered 2018-12-07 – 2018-12-08 (×3): 10 meq via INTRAVENOUS
  Filled 2018-12-07 (×3): qty 100

## 2018-12-07 MED ORDER — IPRATROPIUM-ALBUTEROL 0.5-2.5 (3) MG/3ML IN SOLN
3.0000 mL | Freq: Once | RESPIRATORY_TRACT | Status: AC
  Administered 2018-12-07: 3 mL via RESPIRATORY_TRACT
  Filled 2018-12-07: qty 3

## 2018-12-07 NOTE — ED Triage Notes (Signed)
Pt BIB [facility] from [facility], staff reports decreased mental status tonight. Initially hypotensive with EMS, 80/52, given 500ccNS by EMS, BP improved to 104/70. Per facility, pt given daily trazadone and ativan. Productive cough.

## 2018-12-07 NOTE — ED Provider Notes (Addendum)
I saw and evaluated the patient, reviewed the resident's note and I agree with the findings and plan.  EKG: None  85 year old female history of dementia nursing home with altered mental status and acute shortness of breath.  She is a DNR.  Lungs have crackles bilaterally.  Suspect possible pneumonia versus CHF.  Labs and x-rays pending.  CRITICAL CARE Performed by: Leota Jacobsen Total critical care time: 50 minutes Critical care time was exclusive of separately billable procedures and treating other patients. Critical care was necessary to treat or prevent imminent or life-threatening deterioration. Critical care was time spent personally by me on the following activities: development of treatment plan with patient and/or surrogate as well as nursing, discussions with consultants, evaluation of patient's response to treatment, examination of patient, obtaining history from patient or surrogate, ordering and performing treatments and interventions, ordering and review of laboratory studies, ordering and review of radiographic studies, pulse oximetry and re-evaluation of patient's condition.    Lacretia Leigh, MD 12/07/18 2032    Lacretia Leigh, MD 12/22/18 [phone]

## 2018-12-07 NOTE — H&P (Addendum)
History and Physical    Emily Bautista AST:[id number] DOB: 04-15-1922 DOA: 12/07/2018  PCP: Lorne Skeens, MD Patient coming from: [facility]  Chief Complaint: Altered mental status  HPI: Emily Bautista is a 85 y.o. female with medical history significant of dementia, COPD, hyperlipidemia, hypothyroidism, TIA presenting from her nursing home for evaluation of altered mental status.  Per EMS, blood pressure 80/52 and received a 500 cc normal saline bolus.  Per facility, patient was given daily trazodone and Ativan.  She has been having a productive cough.  Patient is very somnolent and no history could be obtained from her.  Per grandson and his wife at bedside the nursing home informed them that the patient appeared more confused and lethargic today.  They were told by the nursing home that the patient was coughing and they were suspecting pneumonia. No additional history could be obtained from family.  Review of Systems: As per HPI otherwise 10 point review of systems negative.  Past Medical History:  Diagnosis Date  . Breast cancer ([place])   . Compression fracture of lumbar vertebra ([place])   . COPD (chronic obstructive pulmonary disease) ([place])   . Dementia ([place])   . Hepatitis B infection   . Hyperlipemia   . Hypothyroidism   . Thyroid disease   . TIA (transient ischemic attack)     Past Surgical History:  Procedure Laterality Date  . APPENDECTOMY    . FEMUR IM NAIL  07/09/2012   Procedure: INTRAMEDULLARY (IM) NAIL FEMORAL;  Surgeon: Augustin Schooling, MD;  Location: [place];  Service: Orthopedics;  Laterality: Left;  . INTRAMEDULLARY (IM) NAIL INTERTROCHANTERIC Right 11/19/2016   Procedure: INTRAMEDULLARY (IM) NAIL INTERTROCHANTRIC/HIP SCREW RIGHT HIP;  Surgeon: Mcarthur Rossetti, MD;  Location: [place];  Service: Orthopedics;  Laterality: Right;  . MASTECTOMY     right sided  . WRIST SURGERY       reports that she has quit smoking. She smoked 0.00 packs per day for 60.00  years. She has quit using smokeless tobacco. She reports that she does not drink alcohol or use drugs.  No Known Allergies  No family history on file.  Prior to Admission medications   Medication Sig Start Date End Date Taking? Authorizing Provider  acetaminophen (TYLENOL) 500 MG tablet Take 500 mg by mouth every 6 (six) hours as needed for mild pain.    [provider]  acetaminophen-codeine (TYLENOL #3) 300-30 MG tablet Take 1 tablet by mouth every 6 (six) hours as needed for moderate pain.    [provider]  aspirin EC 325 MG EC tablet Take 1 tablet (325 mg total) by mouth daily with breakfast. 11/21/16   Mcarthur Rossetti, MD  cholecalciferol (VITAMIN D) 1000 UNITS tablet Take 1,000 Units by mouth daily.    [provider]  guaiFENesin-dextromethorphan (ROBITUSSIN DM) 100-10 MG/5ML syrup Take 15 mLs by mouth every 4 (four) hours as needed for cough.    [provider]  levothyroxine (SYNTHROID, LEVOTHROID) 75 MCG tablet Take 75 mcg by mouth daily before breakfast.    [provider]  loperamide (IMODIUM A-D) 2 MG tablet Take 2 mg by mouth 4 (four) times daily as needed for diarrhea or loose stools.    [provider]  magnesium hydroxide (MILK OF MAGNESIA) 400 MG/5ML suspension Take 30 mLs by mouth daily as needed for mild constipation.    [provider]  polyethylene glycol (MIRALAX / GLYCOLAX) packet Take 17 g by mouth daily as  needed. For constipation    [provider]  potassium chloride 20 MEQ TBCR Take 20 mEq by mouth daily. 11/21/16 11/24/16  Oswald Hillock, MD  senna-docusate (SENOKOT-S) 8.6-50 MG per tablet Take 2 tablets by mouth daily.     [provider]  traZODone (DESYREL) 50 MG tablet Take 50 mg by mouth at bedtime.    [provider]    Physical Exam: Vitals:   12/07/18 2134 12/07/18 2230 12/07/18 2300 12/07/18 2330  BP: (!) 75/46 (!) 76/47 (!) 81/48 (!) 83/56  Pulse: (!) 56  (!) 56 (!) 55 (!) 55  Resp: 18 18 11 17   Temp:      TempSrc:      SpO2: 94% 94% 98% 99%    Physical Exam  Constitutional:  Frail elderly female  HENT:  Head: Normocephalic.  Eyes: Right eye exhibits no discharge. Left eye exhibits no discharge.  Neck: JVD present.  Cardiovascular: Normal rate, regular rhythm and intact distal pulses.  Pulmonary/Chest:  Equal air entry on auscultation of anterior lung fields.  Mild scattered wheezing.  On 3 L supplemental oxygen.  Abdominal: Soft. Bowel sounds are normal. She exhibits no distension. There is no abdominal tenderness. There is no guarding.  Musculoskeletal:        General: Edema present.     Comments: +1 pitting edema of bilateral lower extremities  Neurological:  Very somnolent, not waking up  Skin: Skin is warm and dry. She is not diaphoretic.     Labs on Admission: I have personally reviewed following labs and imaging studies  CBC: Recent Labs  Lab 12/07/18 2038  WBC 6.0  NEUTROABS 5.1  HGB 7.2*  HCT 24.7*  MCV 87.6  PLT 413   Basic Metabolic Panel: Recent Labs  Lab 12/07/18 2038  NA 138  K 3.2*  CL 105  CO2 23  GLUCOSE 125*  BUN 22  CREATININE 0.54  CALCIUM 8.5*   GFR: CrCl cannot be calculated (Unknown ideal weight.). Liver Function Tests: Recent Labs  Lab 12/07/18 2038  AST 31  ALT 17  ALKPHOS 72  BILITOT 0.5  PROT 5.8*  ALBUMIN 2.6*   No results for input(s): LIPASE, AMYLASE in the last 168 hours. No results for input(s): AMMONIA in the last 168 hours. Coagulation Profile: No results for input(s): INR, PROTIME in the last 168 hours. Cardiac Enzymes: No results for input(s): CKTOTAL, CKMB, CKMBINDEX, TROPONINI in the last 168 hours. BNP (last 3 results) No results for input(s): PROBNP in the last 8760 hours. HbA1C: No results for input(s): HGBA1C in the last 72 hours. CBG: No results for input(s): GLUCAP in the last 168 hours. Lipid Profile: No results for input(s): CHOL, HDL, LDLCALC,  TRIG, CHOLHDL, LDLDIRECT in the last 72 hours. Thyroid Function Tests: No results for input(s): TSH, T4TOTAL, FREET4, T3FREE, THYROIDAB in the last 72 hours. Anemia Panel: No results for input(s): VITAMINB12, FOLATE, FERRITIN, TIBC, IRON, RETICCTPCT in the last 72 hours. Urine analysis:    Component Value Date/Time   COLORURINE AMBER (A) 12/07/2018 2100   APPEARANCEUR CLOUDY (A) 12/07/2018 2100   LABSPEC 1.023 12/07/2018 2100   PHURINE 5.0 12/07/2018 2100   GLUCOSEU NEGATIVE 12/07/2018 2100   HGBUR NEGATIVE 12/07/2018 2100   [place] NEGATIVE 12/07/2018 2100   [place] NEGATIVE 12/07/2018 2100   PROTEINUR NEGATIVE 12/07/2018 2100   UROBILINOGEN 0.2 07/09/2012 1106   NITRITE POSITIVE (A) 12/07/2018 2100   LEUKOCYTESUR NEGATIVE 12/07/2018 2100    Radiological Exams on Admission: Dg Chest  Portable 1 View  Result Date: 12/07/2018 CLINICAL DATA:  Cough, altered mental status, hypotensive which improved with a normal saline bolus, history breast cancer, COPD, dementia, former smoker EXAM: PORTABLE CHEST 1 VIEW COMPARISON:  Portable exam 2027 hours compared to 11/19/2016 FINDINGS: Enlargement of cardiac silhouette. Atherosclerotic calcification aorta. Mediastinal contours normal. Emphysematous changes with chronic interstitial prominence in the lungs. Superimposed atelectasis versus consolidation at the lung bases with suspected small pleural effusions. No pneumothorax. Bones demineralized. IMPRESSION: COPD changes with bibasilar atelectasis versus infiltrate and suspected small pleural effusions. Electronically Signed   By: Lavonia Dana M.D.   On: 12/07/2018 20:46   Dg Hip Port Unilat W Or Wo Pelvis 1 View Right  Result Date: 12/07/2018 CLINICAL DATA:  Proximal right hip pain EXAM: DG HIP (WITH OR WITHOUT PELVIS) 1V PORT RIGHT COMPARISON:  12/31/2016 FINDINGS: Medullary rod and fixation screws are noted in the proximal right femur. Similar findings are noted on the left. No acute fracture is  noted in the proximal right femur. Previously seen fracture fragments are identified with callus formation. Some irregularity is noted in the lateral aspect of the right obturator foramen. The possibility of fracture in this area could not be totally excluded. CT may be helpful for further evaluation. IMPRESSION: Postoperative changes in the proximal femurs bilaterally. Mild irregularity in the lateral aspect of the right obturator foramen as described. CT is recommended for further evaluation. Electronically Signed   By: Inez Catalina M.D.   On: 12/07/2018 22:31    EKG: Not done in the ED.  Assessment/Plan Principal Problem:   Severe sepsis (HCC) Active Problems:   Hypotension   Acute on chronic respiratory failure with hypoxia (HCC)   COPD with acute exacerbation (HCC)   Acute encephalopathy   Severe sepsis secondary to UTI and pneumonia -Hypotensive in the ED.  No fever or leukocytosis.  Lactic acid normal. -UA positive for nitrite, 11-20 WBCs, and many bacteria. -Chest x-ray showing COPD changes with bibasilar atelectasis versus infiltrate  -Ceftriaxone and azithromycin -Gentle IV fluid resuscitation -Urine culture pending -Blood culture x2 pending -Influenza panel pending  Acute hypoxic respiratory failure secondary to pneumonia, volume overload, and mild COPD exacerbation -BNP elevated at 1563.  Has JVD and +1 pitting edema of bilateral lower extremities on exam. -Chest x-ray showing COPD changes with bibasilar atelectasis versus infiltrate and suspected small pleural effusions. -Currently requiring 3 L supplemental oxygen, does not use oxygen at nursing home. -Continue ceftriaxone and azithromycin -Mild scattered wheezing appreciated on exam.  Received Solu-Medrol 125 mg at nursing home.  Continue DuoNebs every 6 hours. -Unable to diurese at this time in the setting of hypotension. -Supplemental oxygen -Influenza panel pending  -Echocardiogram -Patient is very somnolent.  Keep  n.p.o. aspiration precautions.  Hypotension -Likely secondary to severe sepsis from UTI and pneumonia. -Bedside ultrasound done by ED provider showing no evidence of right ventricular strain pattern or pericardial effusion.  Approximately 25% IVC collapse noted with inspiration. -Continue gentle IV fluid boluses to keep MAP greater than 65. -Hemoglobin 7.2, lower than baseline of 9-11.  Check FOBT.  Continue to monitor CBC. -Check troponin and EKG -Check cortisol level -Echocardiogram  Acute encephalopathy -Likely secondary to UTI and pneumonia.  In addition, patient has been receiving sedating medications including trazodone and Ativan at the nursing home which could also be contributing.  Continue management as above.   -Patient is very somnolent and not waking up.  Difficult to do a neuro exam at this time.  Does have  a history of prior TIA.  Order STAT head CT to rule out CVA.  Addendum: I went back to reassess the patient and she is currently more awake but continues to be confused.  When asked questions, she is repeating what is said to her.  Head CT results pending.  Hypokalemia -Potassium 3.2. -Replete potassium.  Check magnesium level.  Continue to monitor BMP.   Hypothyroidism -Continue Synthroid in IV form  Addendum: Oxygen saturation down to 60s and patient was placed on nonrebreather.  EKG showing new ST depressions in lateral leads.  Troponin elevated at 3.09.  I discussed the case with cardiology - stated patient is not a candidate for any invasive procedures from cardiology standpoint and no additional recommendations at this time.  I had a goals of care discussion with the patient's grandson and his wife at bedside, they want the patient to be comfort care.  I called and spoke to patient's son and healthcare power of attorney Emily Bautista, he also wants the patient to be comfort care. -Comfort care orders will be placed  DVT prophylaxis: SCDs Code Status: DNR.  Signed  form at bedside. Family Communication: Yolanda Bonine and his wife at bedside. Consults called: None Admission status: It is my clinical opinion that admission to [facility] is reasonable and necessary in this 85 y.o. female . presenting with symptoms of AMS, hypotension, concerning for infection . with pertinent positives on physical exam including: AMS . and pertinent positives on radiographic and laboratory data including: Imaging with evidence of pneumonia and volume overload.  UA suggestive of infection. . Workup and treatment include IV antibiotics, IV fluid.  Closely monitoring hemodynamics.  Given the aforementioned, the predictability of an adverse outcome is felt to be significant. I expect that the patient will require at least 2 midnights in the hospital to treat this condition.   Shela Leff MD [facility] Pager [phone]  If 7PM-7AM, please contact night-coverage [email or web address] Password [email or web address]  12/08/2018, 12:09 AM

## 2018-12-07 NOTE — ED Provider Notes (Signed)
[facility] EMERGENCY DEPARTMENT Provider Note   CSN: [id number] Arrival date & time: 12/07/18  2019     History   Chief Complaint Chief Complaint  Patient presents with  . Altered Mental Status    HPI Emily Bautista is a 85 y.o. female.  HPI  Emily Bautista is a 85 y.o. female with PMH of breast cancer status post mastectomy, compression fracture, dementia, hepatitis B, HLD, hypothyroidism, TIA who presents via EMS from nursing facility after she was slightly more sleepy.  However she reportedly did receive Valium and trazodone earlier this evening.  They are concerned about possible COPD exacerbation and EMS reports that she may have been given 125 mg of IV Solu-Medrol but they were unsure.  Also reported that she may have received a neb but they were not sure.  Normotensive and not tachycardic.  Glucose normal.  GCS ranging around 7 or 8 but appearing to control secretions and maintain her own airway.  Moving all 4 extremities.  Past Medical History:  Diagnosis Date  . Breast cancer ([place])   . Compression fracture of lumbar vertebra ([place])   . COPD (chronic obstructive pulmonary disease) ([place])   . Dementia ([place])   . Hepatitis B infection   . Hyperlipemia   . Hypothyroidism   . Thyroid disease   . TIA (transient ischemic attack)     Patient Active Problem List   Diagnosis Date Noted  . Hypotension 12/07/2018  . Protein-calorie malnutrition, severe 11/20/2016  . Acute cystitis with hematuria   . Closed nondisplaced intertrochanteric fracture of right femur ([place]) 11/19/2016  . Normocytic anemia 11/19/2016  . Anemia due to blood loss, acute 07/11/2012  . Hip fracture, left ([place]) 07/09/2012  . Dementia ([place]) 07/09/2012  . COPD (chronic obstructive pulmonary disease) ([place]) 07/09/2012  . UTI (lower urinary tract infection) 07/09/2012  . Constipation 07/09/2012  . Hypothyroidism   . TIA (transient ischemic attack)   . Hepatitis B infection   . Compression  fracture of lumbar vertebra ([place])   . Breast cancer ([place])   . Hyperlipemia   . Osteoporosis 11/26/2011  . Closed left humeral fracture 11/26/2011  . Falls frequently 11/26/2011  . Abnormal CT scan of head 11/26/2011  . Compression fracture 09/02/2011  . Hypothyroid 09/02/2011  . Osteoporosis with pathological fracture 09/02/2011  . Tobacco abuse 09/02/2011  . Asymptomatic bacteriuria 09/02/2011  . Hyperlipidemia 09/02/2011    Past Surgical History:  Procedure Laterality Date  . APPENDECTOMY    . FEMUR IM NAIL  07/09/2012   Procedure: INTRAMEDULLARY (IM) NAIL FEMORAL;  Surgeon: Augustin Schooling, MD;  Location: [facility];  Service: Orthopedics;  Laterality: Left;  . INTRAMEDULLARY (IM) NAIL INTERTROCHANTERIC Right 11/19/2016   Procedure: INTRAMEDULLARY (IM) NAIL INTERTROCHANTRIC/HIP SCREW RIGHT HIP;  Surgeon: Mcarthur Rossetti, MD;  Location: [facility];  Service: Orthopedics;  Laterality: Right;  . MASTECTOMY     right sided  . WRIST SURGERY       OB History   No obstetric history on file.      Home Medications    Prior to Admission medications   Medication Sig Start Date End Date Taking? Authorizing Provider  acetaminophen (TYLENOL) 325 MG tablet Take 650 mg by mouth every 6 (six) hours as needed for mild pain.   Yes [provider]  aspirin 81 MG chewable tablet Chew 81 mg by mouth daily.   Yes [provider]  cholecalciferol (VITAMIN D) 1000 UNITS tablet Take 1,000 Units  by mouth daily.   Yes [provider]  ibuprofen (ADVIL,MOTRIN) 200 MG tablet Take 200 mg by mouth every 4 (four) hours as needed for fever, headache or mild pain.   Yes [provider]  ipratropium-albuterol (DUONEB) 0.5-2.5 (3) MG/3ML SOLN Take 3 mLs by nebulization every 6 (six) hours.   Yes [provider]  levothyroxine (SYNTHROID, LEVOTHROID) 75 MCG tablet Take 75 mcg by mouth every evening.    Yes [provider]  LORazepam (ATIVAN) 0.5 MG tablet Take  0.5 mg by mouth every evening.   Yes [provider]  polyethylene glycol (MIRALAX / GLYCOLAX) packet Take 17 g by mouth daily. For constipation    Yes [provider]  senna (SENOKOT) 8.6 MG TABS tablet Take 1 tablet by mouth 2 (two) times daily.   Yes [provider]  traZODone (DESYREL) 50 MG tablet Take 25 mg by mouth at bedtime.    Yes [provider]  potassium chloride 20 MEQ TBCR Take 20 mEq by mouth daily. 11/21/16 11/24/16  Oswald Hillock, MD    Family History No family history on file.  Social History Social History   Tobacco Use  . Smoking status: Former Smoker    Packs/day: 0.00    Years: 60.00    Pack years: 0.00  . Smokeless tobacco: Former Systems developer  . Tobacco comment: current uses smokless cigarettes  Substance Use Topics  . Alcohol use: No  . Drug use: No     Allergies   Patient has no known allergies.   Review of Systems Review of Systems  Unable to perform ROS: Mental status change  Respiratory: Positive for cough and shortness of breath.      Physical Exam Updated Vital Signs BP (!) 83/56   Pulse (!) 55   Temp (!) 97.1 F (36.2 C) (Rectal)   Resp 17   SpO2 99%   Physical Exam Vitals signs and nursing note reviewed.  Constitutional:      Appearance: She is well-developed. She is ill-appearing.     Interventions: Nasal cannula in place.  HENT:     Head: Normocephalic and atraumatic.  Eyes:     General: Lids are normal. Vision grossly intact.     Conjunctiva/sclera: Conjunctivae normal.     Pupils: Pupils are equal, round, and reactive to light.     Visual Fields: Right eye visual fields normal and left eye visual fields normal.  Neck:     Musculoskeletal: Neck supple.  Cardiovascular:     Rate and Rhythm: Normal rate and regular rhythm.     Heart sounds: S1 normal and S2 normal. No murmur.  Pulmonary:     Effort: Pulmonary effort is normal. Tachypnea present.     Breath sounds: Examination of the  right-lower field reveals decreased breath sounds. Examination of the left-lower field reveals decreased breath sounds. Decreased breath sounds, rhonchi and rales present.  Abdominal:     Palpations: Abdomen is soft.     Tenderness: There is no abdominal tenderness.  Skin:    General: Skin is warm and dry.  Neurological:     General: No focal deficit present.     Mental Status: She is oriented to person, place, and time. She is lethargic.     GCS: GCS eye subscore is 1. GCS verbal subscore is 3. GCS motor subscore is 5.      ED Treatments / Results  Labs (all labs ordered are listed, but only abnormal results are displayed)  Labs Reviewed  CBC WITH DIFFERENTIAL/PLATELET - Abnormal; Notable for the following components:      Result Value   RBC 2.82 (*)    Hemoglobin 7.2 (*)    HCT 24.7 (*)    MCH 25.5 (*)    MCHC 29.1 (*)    Lymphs Abs 0.6 (*)    All other components within normal limits  COMPREHENSIVE METABOLIC PANEL - Abnormal; Notable for the following components:   Potassium 3.2 (*)    Glucose, Bld 125 (*)    Calcium 8.5 (*)    Total Protein 5.8 (*)    Albumin 2.6 (*)    All other components within normal limits  URINALYSIS, ROUTINE W REFLEX MICROSCOPIC - Abnormal; Notable for the following components:   Color, Urine AMBER (*)    APPearance CLOUDY (*)    Nitrite POSITIVE (*)    Bacteria, UA MANY (*)    All other components within normal limits  BRAIN NATRIURETIC PEPTIDE - Abnormal; Notable for the following components:   B Natriuretic Peptide 1,563.1 (*)    All other components within normal limits  CULTURE, BLOOD (ROUTINE X 2)  CULTURE, BLOOD (ROUTINE X 2)  URINE CULTURE  LACTIC ACID, PLASMA  INFLUENZA PANEL BY PCR (TYPE A & B)  LACTIC ACID, PLASMA  OCCULT BLOOD X 1 CARD TO LAB, STOOL  CBC  BASIC METABOLIC PANEL  MAGNESIUM  TROPONIN I  CORTISOL    EKG None  Radiology Dg Chest Portable 1 View  Result Date: 12/07/2018 CLINICAL DATA:  Cough, altered  mental status, hypotensive which improved with a normal saline bolus, history breast cancer, COPD, dementia, former smoker EXAM: PORTABLE CHEST 1 VIEW COMPARISON:  Portable exam 2027 hours compared to 11/19/2016 FINDINGS: Enlargement of cardiac silhouette. Atherosclerotic calcification aorta. Mediastinal contours normal. Emphysematous changes with chronic interstitial prominence in the lungs. Superimposed atelectasis versus consolidation at the lung bases with suspected small pleural effusions. No pneumothorax. Bones demineralized. IMPRESSION: COPD changes with bibasilar atelectasis versus infiltrate and suspected small pleural effusions. Electronically Signed   By: Lavonia Dana M.D.   On: 12/07/2018 20:46   Dg Hip Port Unilat W Or Wo Pelvis 1 View Right  Result Date: 12/07/2018 CLINICAL DATA:  Proximal right hip pain EXAM: DG HIP (WITH OR WITHOUT PELVIS) 1V PORT RIGHT COMPARISON:  12/31/2016 FINDINGS: Medullary rod and fixation screws are noted in the proximal right femur. Similar findings are noted on the left. No acute fracture is noted in the proximal right femur. Previously seen fracture fragments are identified with callus formation. Some irregularity is noted in the lateral aspect of the right obturator foramen. The possibility of fracture in this area could not be totally excluded. CT may be helpful for further evaluation. IMPRESSION: Postoperative changes in the proximal femurs bilaterally. Mild irregularity in the lateral aspect of the right obturator foramen as described. CT is recommended for further evaluation. Electronically Signed   By: Inez Catalina M.D.   On: 12/07/2018 22:31    Procedures Procedures (including critical care time)  Medications Ordered in ED Medications  azithromycin (ZITHROMAX) 500 mg in sodium chloride 0.9 % 250 mL IVPB (has no administration in time range)  cefTRIAXone (ROCEPHIN) 1 g in sodium chloride 0.9 % 100 mL IVPB (has no administration in time range)    ipratropium-albuterol (DUONEB) 0.5-2.5 (3) MG/3ML nebulizer solution 3 mL (has no administration in time range)  levothyroxine (SYNTHROID, LEVOTHROID) injection 37.5 mcg (has no administration in time range)  acetaminophen (TYLENOL) tablet 650 mg (  has no administration in time range)    Or  acetaminophen (TYLENOL) suppository 650 mg (has no administration in time range)  sodium chloride 0.9 % bolus 500 mL (500 mLs Intravenous New Bag/Given 12/07/18 2345)  potassium chloride 10 mEq in 100 mL IVPB (10 mEq Intravenous New Bag/Given 12/07/18 2345)  lactated ringers bolus 500 mL (500 mLs Intravenous New Bag/Given 12/07/18 2346)  ipratropium-albuterol (DUONEB) 0.5-2.5 (3) MG/3ML nebulizer solution 3 mL (3 mLs Nebulization Given 12/07/18 2047)  cefTRIAXone (ROCEPHIN) 1 g in sodium chloride 0.9 % 100 mL IVPB (0 g Intravenous Stopped 12/07/18 2203)  azithromycin (ZITHROMAX) 500 mg in sodium chloride 0.9 % 250 mL IVPB (0 mg Intravenous Stopped 12/07/18 2344)  lactated ringers bolus 500 mL (0 mLs Intravenous Stopped 12/07/18 2344)     Initial Impression / Assessment and Plan / ED Course  I have reviewed the triage vital signs and the nursing notes.  Pertinent labs & imaging results that were available during my care of the patient were reviewed by me and considered in my medical decision making (see chart for details).     MDM:  Imaging: Chest x-ray shows COPD changes with bibasilar atelectasis versus infiltrate and suspected small pleural effusions.  Pelvis x-ray shows postoperative changes in the proximal femurs bilaterally with mild irregularity in the lateral aspect of the right obturator foramen.  ED Provider Interpretation of EKG: Sinus rhythm with a rate 65 bpm, normal axis, no ST segment elevation or pathologic T wave abnormality.  ST segment depression in leads V4-V6.  These are seen on prior EKGs.  Labs: UA nitrite positive, cloudy, and with many bacteria.  Blood cultures in process, BNP 1563, CBC with  a hemoglobin of 7.2 with baseline around 9-11, platelets 176, white count 6, lactate 1.6, CMP with K of 3.2 otherwise largely unremarkable, flu negative, urine culture in process  On initial evaluation, patient appears ill. Afebrile and hypotensive. GCS 8-9 and minimally responsive as detailed above. Presents with AMS from facility as detailed above.  On exam, coarse BS and crackles BL. Mild increased WOB. Satting high 90s on 2 L [place]. Hx of COPD and review of notes from facility show that she received 125 mg Solu-Medrol as well as single dose of augmentin and levofloxacin for presumed PNA. CXR with findings consistent with COPD and small bilateral pleural effusions.  No pneumonia or pneumothorax.  Given Rocephin and azithromycin empirically prior to result of chest x-ray with concern for possible pneumonia given her presentation.  EKG shows no evidence for ischemia or arrhythmia.  Do not feel the troponin is indicated at this time as the family confirmed that she is a DNR/DNI and they would not want invasive interventions. BNP elevated at greater than 1500.  No prior available for comparison.  Does not appear grossly volume overloaded although patient has crackles on exam.  Developed intermittent hypotension with maps in the low 60s.  No tachycardia.  Lactic acid normal.  Renal function preserved.  No metabolic acidosis or other evidence for shock with exception of her altered mental status which may be secondary to urinary tract infection.  Bedside echocardiogram performed by myself showing slight decrease in EF estimated between 30 and 45% with no evidence for RV strain pattern.  No pericardial effusion.  Approximately 25% IVC collapse with inspiration.  Given 500 mL IV LR bolus with slight improvement in map.  No change in O2 requirement or increased work of breathing.  Map around 60 at that time, additional 500 mL  IV LR bolus ordered.  Plan to give only small IV fluid boluses as tolerated from this point.   Given that she is DNR the family would not pursue invasive measures such as CVC or vasopressors as confirmed by myself at bedside with grandson Sherren Mocha) and his wife.   I confirmed over the phone with her son that her MPOA can be reached at [phone].  Urine infected as above.  No multidrug-resistant organisms on prior urine cultures.  Rocephin should be adequate coverage.  Admitted to hospitalist service awaiting inpatient bed at time of transfer of care.  The plan for this patient was discussed with Dr. Zenia Resides who voiced agreement and who oversaw evaluation and treatment of this patient.    Final Clinical Impressions(s) / ED Diagnoses   Final diagnoses:  Glasgow coma scale total score 3-8, at hospital admission [facility])  Urinary tract infection with hematuria, site unspecified  Hypotension, unspecified hypotension type  Sepsis with encephalopathy, due to unspecified organism, unspecified whether septic shock present [facility])    ED Discharge Orders    None       Jakaya Jacobowitz, Rodena Goldmann, MD 12/07/18 2354    Lacretia Leigh, MD 12/08/18 [phone]

## 2018-12-08 ENCOUNTER — Inpatient Hospital Stay (HOSPITAL_COMMUNITY): Payer: Medicare Other

## 2018-12-08 DIAGNOSIS — R6520 Severe sepsis without septic shock: Secondary | ICD-10-CM

## 2018-12-08 DIAGNOSIS — I959 Hypotension, unspecified: Secondary | ICD-10-CM

## 2018-12-08 DIAGNOSIS — A419 Sepsis, unspecified organism: Secondary | ICD-10-CM | POA: Diagnosis present

## 2018-12-08 DIAGNOSIS — J9621 Acute and chronic respiratory failure with hypoxia: Secondary | ICD-10-CM

## 2018-12-08 DIAGNOSIS — G9340 Encephalopathy, unspecified: Secondary | ICD-10-CM

## 2018-12-08 DIAGNOSIS — J441 Chronic obstructive pulmonary disease with (acute) exacerbation: Secondary | ICD-10-CM

## 2018-12-08 LAB — CORTISOL: Cortisol, Plasma: 7.5 ug/dL

## 2018-12-08 LAB — POCT I-STAT 7, (LYTES, BLD GAS, ICA,H+H)
Bicarbonate: 24.4 mmol/L (ref 20.0–28.0)
Calcium, Ion: 1.24 mmol/L (ref 1.15–1.40)
HCT: 20 % — ABNORMAL LOW (ref 36.0–46.0)
Hemoglobin: 6.8 g/dL — CL (ref 12.0–15.0)
O2 Saturation: 100 %
Patient temperature: 97.1
Potassium: 3.7 mmol/L (ref 3.5–5.1)
SODIUM: 143 mmol/L (ref 135–145)
TCO2: 25 mmol/L (ref 22–32)
pCO2 arterial: 33.3 mmHg (ref 32.0–48.0)
pH, Arterial: 7.469 — ABNORMAL HIGH (ref 7.350–7.450)
pO2, Arterial: 166 mmHg — ABNORMAL HIGH (ref 83.0–108.0)

## 2018-12-08 LAB — MAGNESIUM: Magnesium: 2 mg/dL (ref 1.7–2.4)

## 2018-12-08 LAB — LACTIC ACID, PLASMA: Lactic Acid, Venous: 2.3 mmol/L (ref 0.5–1.9)

## 2018-12-08 LAB — TROPONIN I: Troponin I: 3.09 ng/mL (ref <0.03)

## 2018-12-08 MED ORDER — LORAZEPAM 2 MG/ML PO CONC: 1.0000 mg | ORAL | Status: DC | PRN

## 2018-12-08 MED ORDER — ALBUTEROL SULFATE (2.5 MG/3ML) 0.083% IN NEBU: 2.5000 mg | INHALATION_SOLUTION | RESPIRATORY_TRACT | Status: DC | PRN

## 2018-12-08 MED ORDER — LORAZEPAM 1 MG PO TABS: 1.0000 mg | ORAL_TABLET | ORAL | Status: DC | PRN

## 2018-12-08 MED ORDER — ATROPINE SULFATE 1 % OP SOLN: 4.0000 [drp] | Freq: Four times a day (QID) | OPHTHALMIC | Status: DC | PRN

## 2018-12-08 MED ORDER — IPRATROPIUM-ALBUTEROL 0.5-2.5 (3) MG/3ML IN SOLN
3.0000 mL | Freq: Three times a day (TID) | RESPIRATORY_TRACT | Status: DC
  Administered 2018-12-08: 3 mL via RESPIRATORY_TRACT
  Filled 2018-12-08 (×2): qty 3

## 2018-12-08 MED ORDER — ORAL CARE MOUTH RINSE
15.0000 mL | Freq: Two times a day (BID) | OROMUCOSAL | Status: DC
  Administered 2018-12-08 – 2018-12-09 (×2): 15 mL via OROMUCOSAL

## 2018-12-08 MED ORDER — POLYVINYL ALCOHOL 1.4 % OP SOLN: 1.0000 [drp] | Freq: Four times a day (QID) | OPHTHALMIC | Status: DC | PRN

## 2018-12-08 MED ORDER — MORPHINE SULFATE (PF) 2 MG/ML IV SOLN
1.0000 mg | INTRAVENOUS | Status: DC | PRN
  Administered 2018-12-08 – 2018-12-09 (×8): 1 mg via INTRAVENOUS
  Filled 2018-12-08 (×8): qty 1

## 2018-12-08 MED ORDER — LORAZEPAM 2 MG/ML IJ SOLN
1.0000 mg | INTRAMUSCULAR | Status: DC | PRN
  Administered 2018-12-08 – 2018-12-09 (×3): 1 mg via INTRAVENOUS
  Filled 2018-12-08 (×3): qty 1

## 2018-12-08 NOTE — Clinical Social Work Note (Signed)
Clinical Social Work Assessment  Patient Details  Name: Emily Bautista MRN: [id number] Date of Birth: September 24, 1922  Date of referral:  12/08/18               Reason for consult:  Facility Placement, Discharge Planning, End of Life/Hospice                Permission sought to share information with:  Facility Sport and exercise psychologist, Family Supports Permission granted to share information::     Name::     Kellsey Sansone  Agency::  Hospice Facilities  Relationship::  Son  Contact Information:  [phone]  Housing/Transportation Living arrangements for the past 2 months:  [place] of Information:  Medical Team, Adult Children Patient Interpreter Needed:  None Criminal Activity/Legal Involvement Pertinent to Current Situation/Hospitalization:  No - Comment as needed Significant Relationships:  Adult Children, Other Family Members Lives with:  Facility Resident Do you feel safe going back to the place where you live?  Yes Need for family participation in patient care:  Yes (Comment)  Care giving concerns:  Hospice facility.   Social Worker assessment / plan:  Patient not oriented. No supports at bedside. CSW called son, [name], introduced role, and inquired about interest in a hospice facility for patient. Son said it was the first he had heard about it but it sounded like a good plan. First preference is [facility]. Referral made to [name], [place]. [name] will be back in town around 6:00/7:00 tonight. No further concerns. CSW encouraged patient's son to contact CSW as needed. CSW will continue to follow patient and her son for support and facilitate discharge to hospice facility once bed is available.  Employment status:  Retired Forensic scientist:  Medicare PT Recommendations:  Not assessed at this time [place] / Referral to community resources:  Other (Comment Required)(Hospice facilities)  Patient/Family's Response to care:  Patient not oriented.  Patient's son agreeable to hospice facility. Patient's family supportive and involved in patient's care. Patient's son appreciated social work intervention.  Patient/Family's Understanding of and Emotional Response to Diagnosis, Current Treatment, and Prognosis:  Patient not oriented. Patient's son has a good understanding of the reason for admission and social work involvement. Patient's son appears happy with hospital care.  Emotional Assessment Appearance:  Appears stated age Attitude/Demeanor/Rapport:  Unable to Assess Affect (typically observed):  Unable to Assess Orientation:  (Disoriented x 4) Alcohol / Substance use:  Never Used Psych involvement (Current and /or in the community):  No (Comment)  Discharge Needs  Concerns to be addressed:  Care Coordination Readmission within the last 30 days:  No Current discharge risk:  Terminally ill Barriers to Discharge:  Other([facility] is reviewing referral.)   Candie Chroman, LCSW 12/08/2018, 2:37 PM

## 2018-12-08 NOTE — Progress Notes (Signed)
PROGRESS NOTE    Emily Bautista   OVZ:[id number]  DOB: 06/06/1922  DOA: 12/07/2018 PCP: Lorne Skeens, MD   Brief Narrative:  Emily Bautista is a 85 y.o. female with medical history significant of dementia, COPD, hyperlipidemia, hypothyroidism, TIA presenting from her nursing home for evaluation of altered mental status.  Per EMS, blood pressure 80/52 and received a 500 cc normal saline bolus.  Per facility, patient was given daily trazodone and Ativan.  She has been having a productive cough. The patient was noted to decline quickly in the ED with worsening hypoxia, hypotension and was noted to have ST depression on EKG. At this point, she was transitioned to comfort care.   Subjective: Unresponsive.     Assessment & Plan:   Principal Problem:   Severe sepsis due to UTI and pneumonia Active Problems:   Hypotension   Acute on chronic respiratory failure with hypoxia (HCC)   COPD with acute exacerbation (HCC)   Acute encephalopathy   Currently is comfort care only. Will ask social work to find a residential hospice bed. This has been discussed with her Grandson and her RN today    Time spent in minutes: 30  Antimicrobials:  Anti-infectives (From admission, onward)   Start     Dose/Rate Route Frequency Ordered Stop   12/08/18 2200  azithromycin (ZITHROMAX) 500 mg in sodium chloride 0.9 % 250 mL IVPB  Status:  Discontinued     500 mg 250 mL/hr over 60 Minutes Intravenous Every 24 hours 12/07/18 2307 12/08/18 0250   12/08/18 2000  cefTRIAXone (ROCEPHIN) 1 g in sodium chloride 0.9 % 100 mL IVPB  Status:  Discontinued     1 g 200 mL/hr over 30 Minutes Intravenous Every 24 hours 12/07/18 2307 12/08/18 0250   12/07/18 2115  cefTRIAXone (ROCEPHIN) 1 g in sodium chloride 0.9 % 100 mL IVPB     1 g 200 mL/hr over 30 Minutes Intravenous  Once 12/07/18 2112 12/07/18 2203   12/07/18 2115  azithromycin (ZITHROMAX) 500 mg in sodium chloride 0.9 % 250 mL IVPB     500 mg 250 mL/hr over  60 Minutes Intravenous  Once 12/07/18 2112 12/07/18 2344       Objective: Vitals:   12/08/18 0840 12/08/18 0847 12/08/18 0850 12/08/18 1009  BP:    (!) 196/139  Pulse: 61 61 61 71  Resp: 19 17 16 16   Temp:    (!) 97.5 F (36.4 C)  TempSrc:    Oral  SpO2: 93% 100% 100% 99%    Intake/Output Summary (Last 24 hours) at 12/08/2018 1307 Last data filed at 12/08/2018 4128 Gross per 24 hour  Intake 1100 ml  Output -  Net 1100 ml   There were no vitals filed for this visit.  Examination: General exam: Appears comfortable, asleep.   Data Reviewed: I have personally reviewed following labs and imaging studies  CBC: Recent Labs  Lab 12/07/18 2038 12/08/18 0123  WBC 6.0  --   NEUTROABS 5.1  --   HGB 7.2* 6.8*  HCT 24.7* 20.0*  MCV 87.6  --   PLT 176  --    Basic Metabolic Panel: Recent Labs  Lab 12/07/18 2038 12/08/18 0049 12/08/18 0123  NA 138  --  143  K 3.2*  --  3.7  CL 105  --   --   CO2 23  --   --   GLUCOSE 125*  --   --   BUN 22  --   --  CREATININE 0.54  --   --   CALCIUM 8.5*  --   --   MG  --  2.0  --    GFR: CrCl cannot be calculated (Unknown ideal weight.). Liver Function Tests: Recent Labs  Lab 12/07/18 2038  AST 31  ALT 17  ALKPHOS 72  BILITOT 0.5  PROT 5.8*  ALBUMIN 2.6*   No results for input(s): LIPASE, AMYLASE in the last 168 hours. No results for input(s): AMMONIA in the last 168 hours. Coagulation Profile: No results for input(s): INR, PROTIME in the last 168 hours. Cardiac Enzymes: Recent Labs  Lab 12/08/18 0049  TROPONINI 3.09*   BNP (last 3 results) No results for input(s): PROBNP in the last 8760 hours. HbA1C: No results for input(s): HGBA1C in the last 72 hours. CBG: No results for input(s): GLUCAP in the last 168 hours. Lipid Profile: No results for input(s): CHOL, HDL, LDLCALC, TRIG, CHOLHDL, LDLDIRECT in the last 72 hours. Thyroid Function Tests: No results for input(s): TSH, T4TOTAL, FREET4, T3FREE, THYROIDAB in  the last 72 hours. Anemia Panel: No results for input(s): VITAMINB12, FOLATE, FERRITIN, TIBC, IRON, RETICCTPCT in the last 72 hours. Urine analysis:    Component Value Date/Time   COLORURINE AMBER (A) 12/07/2018 2100   APPEARANCEUR CLOUDY (A) 12/07/2018 2100   LABSPEC 1.023 12/07/2018 2100   PHURINE 5.0 12/07/2018 2100   GLUCOSEU NEGATIVE 12/07/2018 2100   HGBUR NEGATIVE 12/07/2018 2100   [place] NEGATIVE 12/07/2018 2100   [place] NEGATIVE 12/07/2018 2100   PROTEINUR NEGATIVE 12/07/2018 2100   UROBILINOGEN 0.2 07/09/2012 1106   NITRITE POSITIVE (A) 12/07/2018 2100   LEUKOCYTESUR NEGATIVE 12/07/2018 2100   Sepsis Labs: @LABRCNTIP (procalcitonin:4,lacticidven:4) )No results found for this or any previous visit (from the past 240 hour(s)).       Radiology Studies: Ct Head Wo Contrast  Result Date: 12/08/2018 CLINICAL DATA:  Altered mental status EXAM: CT HEAD WITHOUT CONTRAST TECHNIQUE: Contiguous axial images were obtained from the base of the skull through the vertex without intravenous contrast. COMPARISON:  07/09/2012 FINDINGS: Brain: severe diffuse cerebral atrophy and chronic small vessel disease changes. Associated ventriculomegaly. Findings are similar to prior study. No acute intracranial abnormality. Specifically, no hemorrhage, hydrocephalus, mass lesion, acute infarction, or significant intracranial injury. Vascular: No hyperdense vessel or unexpected calcification. Skull: No acute calvarial abnormality. Sinuses/Orbits: Visualized paranasal sinuses and mastoids clear. Orbital soft tissues unremarkable. Other: None IMPRESSION: Severe diffuse cerebral atrophy and chronic small vessel disease. No acute intracranial abnormality. Electronically Signed   By: Rolm Baptise M.D.   On: 12/08/2018 00:29   Dg Chest Portable 1 View  Result Date: 12/07/2018 CLINICAL DATA:  Cough, altered mental status, hypotensive which improved with a normal saline bolus, history breast cancer,  COPD, dementia, former smoker EXAM: PORTABLE CHEST 1 VIEW COMPARISON:  Portable exam 2027 hours compared to 11/19/2016 FINDINGS: Enlargement of cardiac silhouette. Atherosclerotic calcification aorta. Mediastinal contours normal. Emphysematous changes with chronic interstitial prominence in the lungs. Superimposed atelectasis versus consolidation at the lung bases with suspected small pleural effusions. No pneumothorax. Bones demineralized. IMPRESSION: COPD changes with bibasilar atelectasis versus infiltrate and suspected small pleural effusions. Electronically Signed   By: Lavonia Dana M.D.   On: 12/07/2018 20:46   Dg Hip Port Unilat W Or Wo Pelvis 1 View Right  Result Date: 12/07/2018 CLINICAL DATA:  Proximal right hip pain EXAM: DG HIP (WITH OR WITHOUT PELVIS) 1V PORT RIGHT COMPARISON:  12/31/2016 FINDINGS: Medullary rod and fixation screws are noted in the proximal right  femur. Similar findings are noted on the left. No acute fracture is noted in the proximal right femur. Previously seen fracture fragments are identified with callus formation. Some irregularity is noted in the lateral aspect of the right obturator foramen. The possibility of fracture in this area could not be totally excluded. CT may be helpful for further evaluation. IMPRESSION: Postoperative changes in the proximal femurs bilaterally. Mild irregularity in the lateral aspect of the right obturator foramen as described. CT is recommended for further evaluation. Electronically Signed   By: Inez Catalina M.D.   On: 12/07/2018 22:31      Scheduled Meds: . ipratropium-albuterol  3 mL Nebulization Q6H   Continuous Infusions:   LOS: 1 day      Debbe Odea, MD [facility] Pager: [email or web address] Password [email or web address] 12/08/2018, 1:07 PM

## 2018-12-08 NOTE — Progress Notes (Signed)
Pt resting comfortably. Family at bedside.

## 2018-12-08 NOTE — Progress Notes (Signed)
[facility] room available for patient tomorrow. Spoke with son [name] by phone to confirm plan to meet tomorrow morning to complete paper work. CSW [place] aware. Will update again when paper work complete.   Thank you,  [name], LCSW [phone]

## 2018-12-08 NOTE — Progress Notes (Signed)
Pt converted to O2/2L [place] for comfort.  [place] for comfort.

## 2018-12-08 NOTE — Progress Notes (Signed)
   12/08/18 1200  Clinical Encounter Type  Visited With Patient  Visit Type Initial  Referral From Nurse  Spiritual Encounters  Spiritual Needs Emotional  Stress Factors  Patient Stress Factors Exhausted   Met w/ pt at recommendation of charge RN.  No family currently present.  Spoke briefly to pt, but she did not appear to respond to my words or presence.  Chaplain remains available.

## 2018-12-08 NOTE — Social Work (Signed)
CSW acknowledging consult for comfort care. Will follow for disposition should hospice services or residential hospice become appropriate.   Halden Phegley H Carlesha Seiple, LCSWA [place] Clinical Social Work [phone]   

## 2018-12-08 NOTE — Progress Notes (Signed)
Nutrition Brief Note  Chart reviewed. Pt now transitioning to comfort care.  No further nutrition interventions warranted at this time.  Please re-consult as needed.   Donyae Kilner A. Misaki Sozio, RD, LDN, CDE Pager: [phone] After hours Pager: [phone]  

## 2018-12-09 DIAGNOSIS — R319 Hematuria, unspecified: Secondary | ICD-10-CM

## 2018-12-09 DIAGNOSIS — N390 Urinary tract infection, site not specified: Secondary | ICD-10-CM

## 2018-12-09 MED ORDER — MORPHINE SULFATE (PF) 2 MG/ML IV SOLN: 1.0000 mg | INTRAVENOUS | 0 refills | Status: AC | PRN

## 2018-12-09 MED ORDER — POLYVINYL ALCOHOL 1.4 % OP SOLN: 1.0000 [drp] | Freq: Four times a day (QID) | OPHTHALMIC | 0 refills | Status: AC | PRN

## 2018-12-09 MED ORDER — LORAZEPAM 2 MG/ML IJ SOLN: 1.0000 mg | INTRAMUSCULAR | 0 refills | Status: AC | PRN

## 2018-12-09 MED ORDER — ATROPINE SULFATE 1 % OP SOLN: 4.0000 [drp] | Freq: Four times a day (QID) | OPHTHALMIC | 12 refills | Status: AC | PRN

## 2018-12-09 NOTE — Progress Notes (Signed)
PMT consult received and chart reviewed. Patient is comfort measures only. Comfort medications on MAR. Spoke with hospice liaison who is meeting with family at [facility] to complete hospice paperwork. Plan is for transfer to [facility] today. Encouraged hospice liaison and RN to contact PMT provider if any needs. Thank you.  NO CHARGE  Ihor Dow, FNP-C Palliative Medicine Team  Phone: [phone] Fax: [phone]

## 2018-12-09 NOTE — Progress Notes (Signed)
[facility]  Completed paper work with sons [name] and [name] for transfer to [facility] today.   Discharge summary has been sent to [facility].  RN please call report to [facility] at [phone].  Thank you,  [name], LCSW [phone]

## 2018-12-09 NOTE — Discharge Summary (Signed)
Physician Discharge Summary  ROONEY GLADWIN KGM:[id number] DOB: July 13, 1922 DOA: 12/07/2018  PCP: Lorne Skeens, MD  Admit date: 12/07/2018 Discharge date: 12/09/2018  Admitted From: SNF Disposition:  [facility]   Recommendations for Outpatient Follow-up:  1. PRN Ativan and Morphine should be continued for comfort   Discharge Condition:  stable   CODE STATUS:  DNR     Principal Problem:   Severe sepsis due to UTI and pneumonia Active Problems:  Acute MI Severe anemia   Hypotension   Acute on chronic respiratory failure with hypoxia    COPD with acute exacerbation     Acute encephalopathy Dementia    Brief Summary: Emily Bautista is a 85 y.o.femalewith medical history significant ofdementia, COPD, hyperlipidemia, hypothyroidism, TIA presenting from her nursing home for evaluation of altered mental status and lethragy. She has been having a productive cough at the SNF. Per EMS, blood pressure was 80/52 and received a 500 cc normal saline bolus. She was somnolent in the ED. She was suspected to have a pneumonia and a UTI and was treated with antibiotics and IVF. Hb was low at 7.2. The patient was noted to decline quickly in the ED with worsening hypoxia (pulse ox in 60s), hypotension and was noted to have ST depression on EKG. Her Troponin was noted to be 3.09 and repeat Hb was 6.8. At this point, aggressive care vs comfort care was discussed with the patient's son and POA, Clydie Dillen. She was transitioned to comfort care.  Hospital Course:   She has been essentially unresponsive and has required Morphine and Ativan for agitation, cough and rapid respiratory rate. Today she is noted to have a severe cough. She continues to require Ativan and Morphine for comfort. She is being transitioned to [facility] today. I have spoken with both of her sons today and they are in agreement with the current plan.     Discharge Exam: Vitals:   12/08/18 1946 12/09/18 0616  BP:  117/64   Pulse:  81  Resp:  16  Temp:  98.2 F (36.8 C)  SpO2: 98% (!) 88%   Vitals:   12/08/18 1519 12/08/18 1525 12/08/18 1946 12/09/18 0616  BP:    117/64  Pulse:    81  Resp:    16  Temp:    98.2 F (36.8 C)  TempSrc:    Oral  SpO2:  98% 98% (!) 88%  Weight: 47.5 kg     Height: 5\' 2"  (1.575 m)       General: Pt is alert, asleep/lethargic Cardiovascular: RRR, S1/S2 +  Respiratory: rhonchi bilaterally, cough with congestion    Discharge Instructions   Allergies as of 12/09/2018   No Known Allergies     Medication List    STOP taking these medications   acetaminophen 325 MG tablet Commonly known as:  TYLENOL   aspirin 81 MG chewable tablet   cholecalciferol 1000 units tablet Commonly known as:  VITAMIN D   ibuprofen 200 MG tablet Commonly known as:  ADVIL,MOTRIN   ipratropium-albuterol 0.5-2.5 (3) MG/3ML Soln Commonly known as:  DUONEB   levothyroxine 75 MCG tablet Commonly known as:  SYNTHROID, LEVOTHROID   LORazepam 0.5 MG tablet Commonly known as:  ATIVAN Replaced by:  LORazepam 2 MG/ML injection   polyethylene glycol packet Commonly known as:  MIRALAX / GLYCOLAX   Potassium Chloride ER 20 MEQ Tbcr   senna 8.6 MG Tabs tablet Commonly known as:  SENOKOT   traZODone 50 MG tablet  Commonly known as:  DESYREL     TAKE these medications   atropine 1 % ophthalmic solution Place 4 drops under the tongue 4 (four) times daily as needed (Excessive secretions).   LORazepam 2 MG/ML injection Commonly known as:  ATIVAN Inject 0.5 mLs (1 mg total) into the vein every 4 (four) hours as needed for anxiety. Replaces:  LORazepam 0.5 MG tablet   morphine 2 MG/ML injection Inject 0.5 mLs (1 mg total) into the vein every 2 (two) hours as needed (or dyspnea).   polyvinyl alcohol 1.4 % ophthalmic solution Commonly known as:  LIQUIFILM TEARS Place 1 drop into both eyes 4 (four) times daily as needed for dry eyes.       No Known  Allergies   Procedures/Studies:   Ct Head Wo Contrast  Result Date: 12/08/2018 CLINICAL DATA:  Altered mental status EXAM: CT HEAD WITHOUT CONTRAST TECHNIQUE: Contiguous axial images were obtained from the base of the skull through the vertex without intravenous contrast. COMPARISON:  07/09/2012 FINDINGS: Brain: severe diffuse cerebral atrophy and chronic small vessel disease changes. Associated ventriculomegaly. Findings are similar to prior study. No acute intracranial abnormality. Specifically, no hemorrhage, hydrocephalus, mass lesion, acute infarction, or significant intracranial injury. Vascular: No hyperdense vessel or unexpected calcification. Skull: No acute calvarial abnormality. Sinuses/Orbits: Visualized paranasal sinuses and mastoids clear. Orbital soft tissues unremarkable. Other: None IMPRESSION: Severe diffuse cerebral atrophy and chronic small vessel disease. No acute intracranial abnormality. Electronically Signed   By: Rolm Baptise M.D.   On: 12/08/2018 00:29   Dg Chest Portable 1 View  Result Date: 12/07/2018 CLINICAL DATA:  Cough, altered mental status, hypotensive which improved with a normal saline bolus, history breast cancer, COPD, dementia, former smoker EXAM: PORTABLE CHEST 1 VIEW COMPARISON:  Portable exam 2027 hours compared to 11/19/2016 FINDINGS: Enlargement of cardiac silhouette. Atherosclerotic calcification aorta. Mediastinal contours normal. Emphysematous changes with chronic interstitial prominence in the lungs. Superimposed atelectasis versus consolidation at the lung bases with suspected small pleural effusions. No pneumothorax. Bones demineralized. IMPRESSION: COPD changes with bibasilar atelectasis versus infiltrate and suspected small pleural effusions. Electronically Signed   By: Lavonia Dana M.D.   On: 12/07/2018 20:46   Dg Hip Port Unilat W Or Wo Pelvis 1 View Right  Result Date: 12/07/2018 CLINICAL DATA:  Proximal right hip pain EXAM: DG HIP (WITH OR WITHOUT  PELVIS) 1V PORT RIGHT COMPARISON:  12/31/2016 FINDINGS: Medullary rod and fixation screws are noted in the proximal right femur. Similar findings are noted on the left. No acute fracture is noted in the proximal right femur. Previously seen fracture fragments are identified with callus formation. Some irregularity is noted in the lateral aspect of the right obturator foramen. The possibility of fracture in this area could not be totally excluded. CT may be helpful for further evaluation. IMPRESSION: Postoperative changes in the proximal femurs bilaterally. Mild irregularity in the lateral aspect of the right obturator foramen as described. CT is recommended for further evaluation. Electronically Signed   By: Inez Catalina M.D.   On: 12/07/2018 22:31      The results of significant diagnostics from this hospitalization (including imaging, microbiology, ancillary and laboratory) are listed below for reference.     Microbiology: Recent Results (from the past 240 hour(s))  Urine culture     Status: Abnormal (Preliminary result)   Collection Time: 12/07/18  8:29 PM  Result Value Ref Range Status   Specimen Description URINE, CLEAN CATCH  Final   Special Requests  Final    NONE Performed at [facility], [place] [address], [place], [place] [address]    Culture >=100,000 COLONIES/mL GRAM NEGATIVE RODS (A)  Final   Report Status PENDING  Incomplete  Blood culture (routine x 2)     Status: None (Preliminary result)   Collection Time: 12/07/18  8:39 PM  Result Value Ref Range Status   Specimen Description BLOOD LEFT ANTECUBITAL  Final   Special Requests   Final    BOTTLES DRAWN AEROBIC AND ANAEROBIC Blood Culture results may not be optimal due to an inadequate volume of blood received in culture bottles   Culture   Final    NO GROWTH 2 DAYS Performed at [facility], [place] [address] [place], [place] [address]    Report Status PENDING  Incomplete  Blood culture (routine x 2)     Status:  None (Preliminary result)   Collection Time: 12/07/18  8:39 PM  Result Value Ref Range Status   Specimen Description BLOOD BLOOD RIGHT FOREARM  Final   Special Requests   Final    BOTTLES DRAWN AEROBIC AND ANAEROBIC Blood Culture adequate volume   Culture   Final    NO GROWTH 2 DAYS Performed at [facility], [place] [address], [place],  [address]    Report Status PENDING  Incomplete     Labs: BNP (last 3 results) Recent Labs    12/07/18 2039  BNP 8,299.3*   Basic Metabolic Panel: Recent Labs  Lab 12/07/18 2038 12/08/18 0049 12/08/18 0123  NA 138  --  143  K 3.2*  --  3.7  CL 105  --   --   CO2 23  --   --   GLUCOSE 125*  --   --   BUN 22  --   --   CREATININE 0.54  --   --   CALCIUM 8.5*  --   --   MG  --  2.0  --    Liver Function Tests: Recent Labs  Lab 12/07/18 2038  AST 31  ALT 17  ALKPHOS 72  BILITOT 0.5  PROT 5.8*  ALBUMIN 2.6*   No results for input(s): LIPASE, AMYLASE in the last 168 hours. No results for input(s): AMMONIA in the last 168 hours. CBC: Recent Labs  Lab 12/07/18 2038 12/08/18 0123  WBC 6.0  --   NEUTROABS 5.1  --   HGB 7.2* 6.8*  HCT 24.7* 20.0*  MCV 87.6  --   PLT 176  --    Cardiac Enzymes: Recent Labs  Lab 12/08/18 0049  TROPONINI 3.09*   BNP: Invalid input(s): POCBNP CBG: No results for input(s): GLUCAP in the last 168 hours. D-Dimer No results for input(s): DDIMER in the last 72 hours. Hgb A1c No results for input(s): HGBA1C in the last 72 hours. Lipid Profile No results for input(s): CHOL, HDL, LDLCALC, TRIG, CHOLHDL, LDLDIRECT in the last 72 hours. Thyroid function studies No results for input(s): TSH, T4TOTAL, T3FREE, THYROIDAB in the last 72 hours.  Invalid input(s): FREET3 Anemia work up No results for input(s): VITAMINB12, FOLATE, FERRITIN, TIBC, IRON, RETICCTPCT in the last 72 hours. Urinalysis    Component Value Date/Time   COLORURINE AMBER (A) 12/07/2018 2100   APPEARANCEUR CLOUDY (A)  12/07/2018 2100   LABSPEC 1.023 12/07/2018 2100   PHURINE 5.0 12/07/2018 2100   GLUCOSEU NEGATIVE 12/07/2018 2100   HGBUR NEGATIVE 12/07/2018 2100   BILIRUBINUR NEGATIVE 12/07/2018 2100   Benjamin Stain  NEGATIVE 12/07/2018 2100   PROTEINUR NEGATIVE 12/07/2018 2100   UROBILINOGEN 0.2 07/09/2012 1106   NITRITE POSITIVE (A) 12/07/2018 2100   LEUKOCYTESUR NEGATIVE 12/07/2018 2100   Sepsis Labs Invalid input(s): PROCALCITONIN,  WBC,  LACTICIDVEN Microbiology Recent Results (from the past 240 hour(s))  Urine culture     Status: Abnormal (Preliminary result)   Collection Time: 12/07/18  8:29 PM  Result Value Ref Range Status   Specimen Description URINE, CLEAN CATCH  Final   Special Requests   Final    NONE Performed at [facility], [place] [address], [place], [place] [address]    Culture >=100,000 COLONIES/mL GRAM NEGATIVE RODS (A)  Final   Report Status PENDING  Incomplete  Blood culture (routine x 2)     Status: None (Preliminary result)   Collection Time: 12/07/18  8:39 PM  Result Value Ref Range Status   Specimen Description BLOOD LEFT ANTECUBITAL  Final   Special Requests   Final    BOTTLES DRAWN AEROBIC AND ANAEROBIC Blood Culture results may not be optimal due to an inadequate volume of blood received in culture bottles   Culture   Final    NO GROWTH 2 DAYS Performed at [facility], [place] [address] [place], [place] [address]    Report Status PENDING  Incomplete  Blood culture (routine x 2)     Status: None (Preliminary result)   Collection Time: 12/07/18  8:39 PM  Result Value Ref Range Status   Specimen Description BLOOD BLOOD RIGHT FOREARM  Final   Special Requests   Final    BOTTLES DRAWN AEROBIC AND ANAEROBIC Blood Culture adequate volume   Culture   Final    NO GROWTH 2 DAYS Performed at [facility], [place] [address] [place],  [address]    Report Status PENDING  Incomplete     Time coordinating discharge in minutes: 65  SIGNED:   Debbe Odea, MD  [facility] 12/09/2018, 10:40 AM Pager   If 7PM-7AM, please contact night-coverage [email or web address] Password [email or web address]

## 2018-12-09 NOTE — Social Work (Signed)
Clinical Social Worker facilitated patient discharge including contacting patient family and facility to confirm patient discharge plans.  Clinical information faxed to facility and family agreeable with plan.  CSW arranged ambulance transport via [facility] to [facility] at 12:45pm.   RN to call [phone]  with report prior to discharge.  Clinical Social Worker will sign off for now as social work intervention is no longer needed. Please consult [place] again if new need arises.  Westley Hummer, MSW, [place] Social Worker [phone]

## 2018-12-09 NOTE — Progress Notes (Signed)
Report called to Clarene Critchley, Therapist, sports at [facility].  Pt resting comfortably and discharged via [facility] to [facility]. Discharge paperwork given to [facility].  Eliezer Bottom [place]

## 2018-12-10 LAB — URINE CULTURE: Culture: >=100000 — AB

## 2018-12-12 LAB — CULTURE, BLOOD (ROUTINE X 2)
CULTURE: NO GROWTH
CULTURE: NO GROWTH
SPECIAL REQUESTS: ADEQUATE

## 2018-12-28 DEATH — deceased

## 2019-06-13 IMAGING — CT CT HEAD W/O CM
4 series · 17 of 47 positions shown, 19 images · non-contrast
Comparison: 07/09/2012

CLINICAL DATA: Altered mental status

EXAM:
CT HEAD WITHOUT CONTRAST
TECHNIQUE: Contiguous axial images were obtained from the base of the skull
through the vertex without intravenous contrast.

[Series 3: head wo · axial · 0.42mm/px · z∈[-144,-24]mm · 6 of 34 slices shown, 8 images]
[im 5/34  brain]
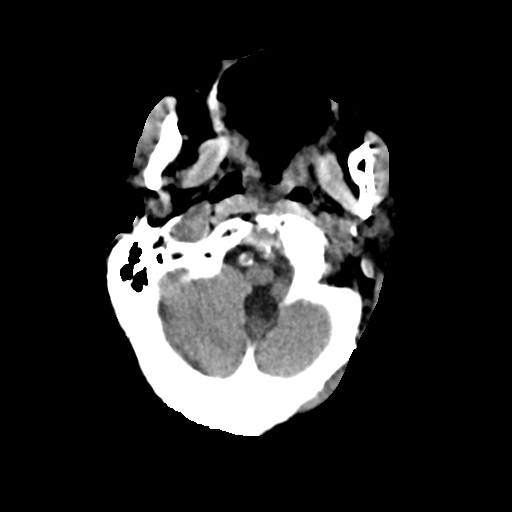
[im 5/34  bone]
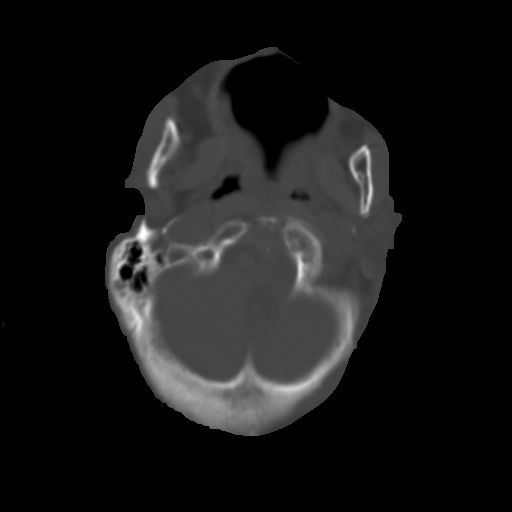
[im 10/34  brain]
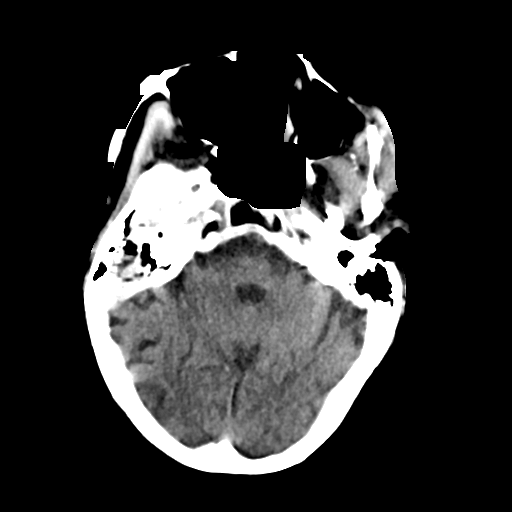
[im 15/34  brain]
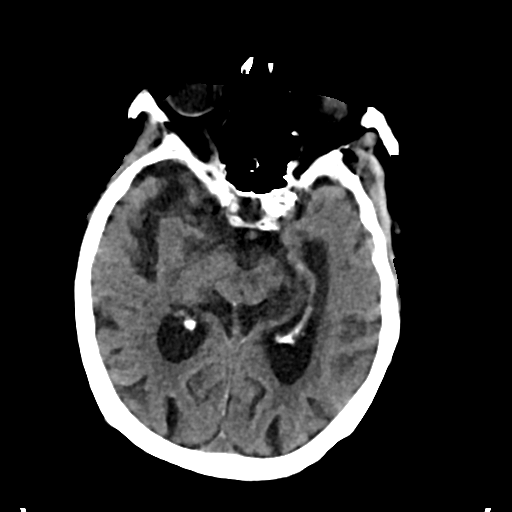
[im 19/34  brain]
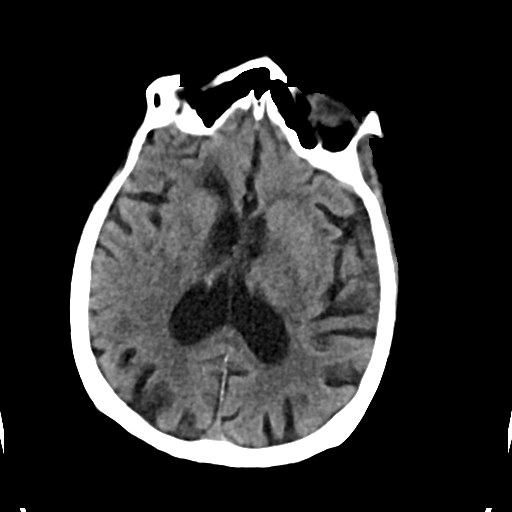
[im 24/34  brain]
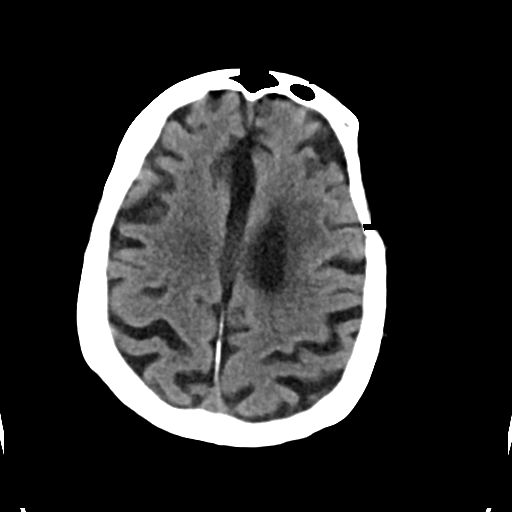
[im 24/34  bone]
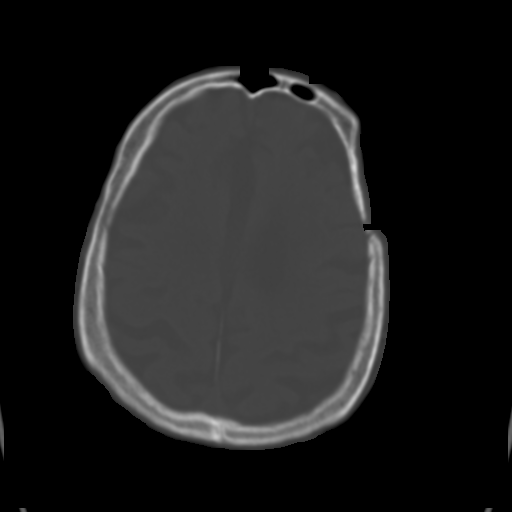
[im 29/34  brain]
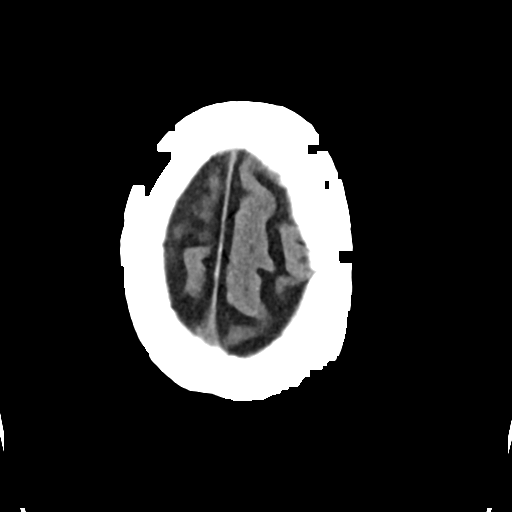

[Series 4: head bone · axial · 0.42mm/px · z∈[-148,-68]mm · 5 of 86 slices shown]
[im 9/86  bone]
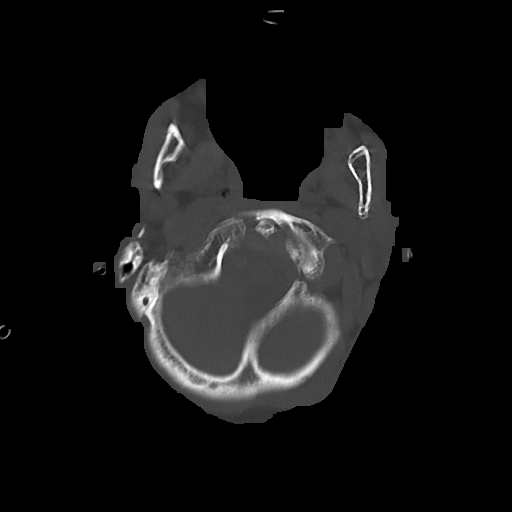
[im 17/86  bone]
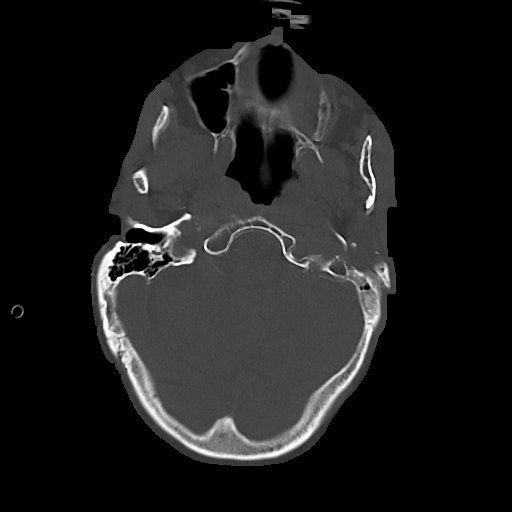
[im 29/86  bone]
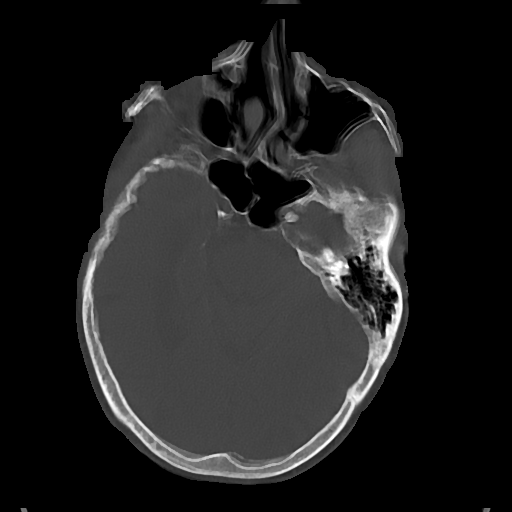
[im 37/86  bone]
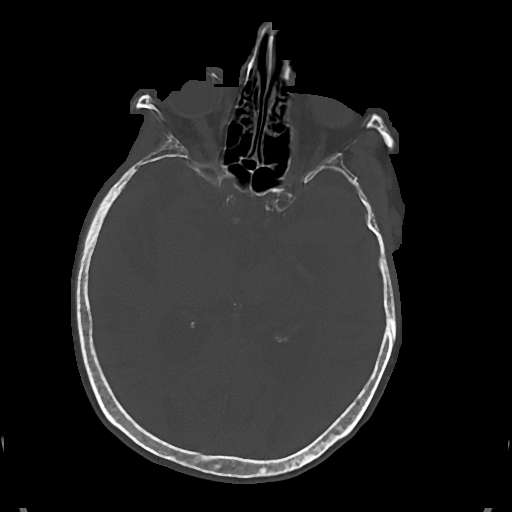
[im 49/86  bone]
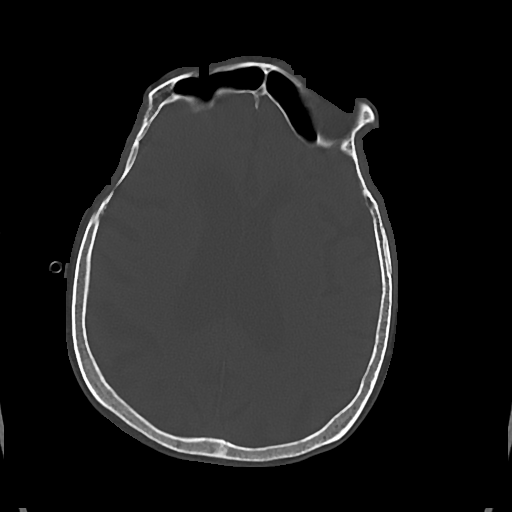

[Series 5: cor soft · coronal · 0.33mm/px · 3 of 67 slices shown]
[im 23/67  brain]
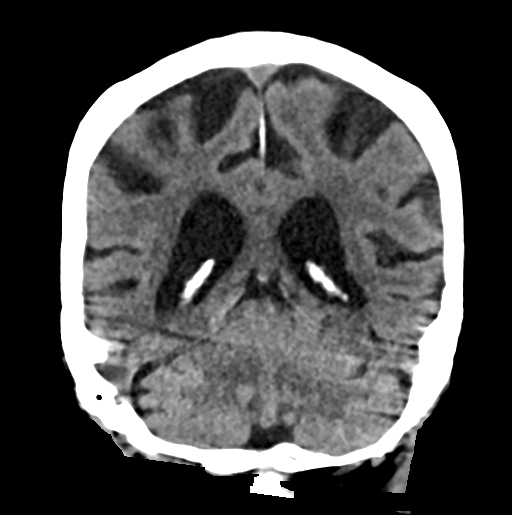
[im 30/67  brain]
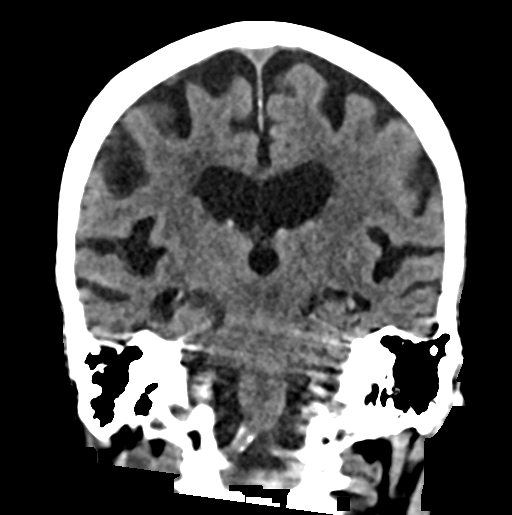
[im 37/67  brain]
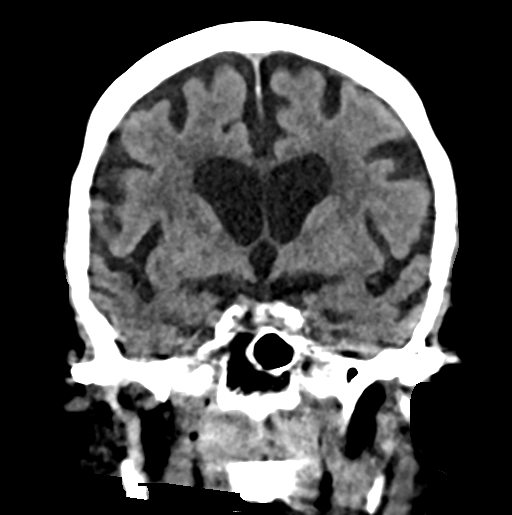

[Series 6: sag soft · sagittal · 0.33mm/px · 3 of 55 slices shown]
[im 19/55  brain]
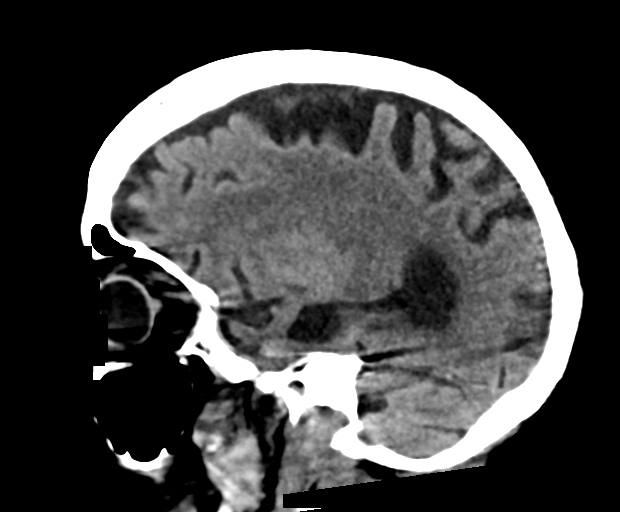
[im 28/55  brain]
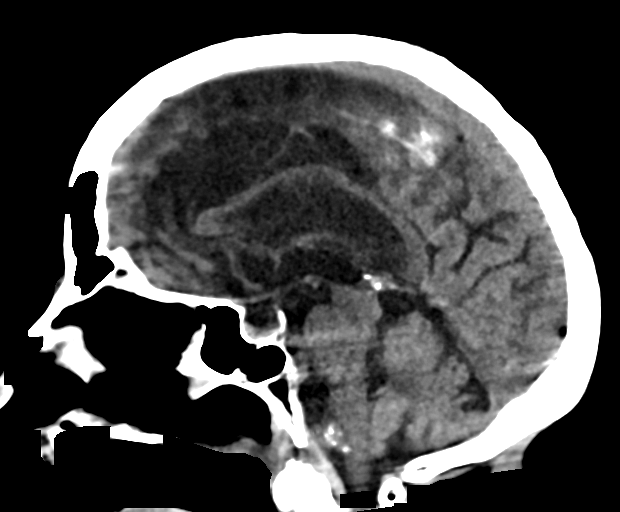
[im 37/55  brain]
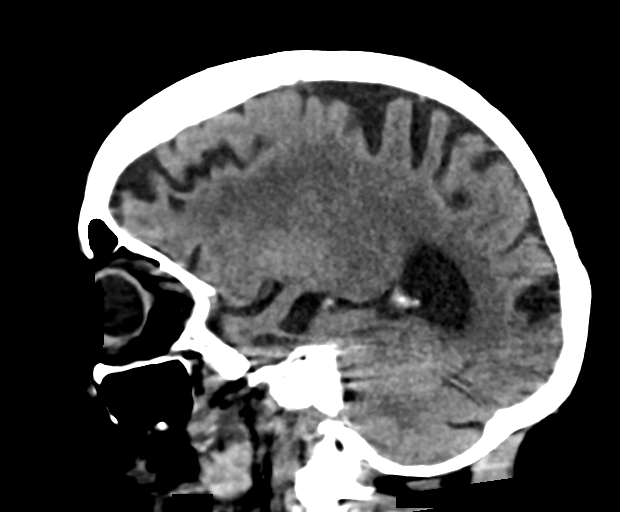

[17 of 47 positions shown; findings below may reference images not displayed]

FINDINGS: Brain: severe diffuse cerebral atrophy and chronic small vessel
disease changes. Associated ventriculomegaly. Findings are similar
to prior study. No acute intracranial abnormality. Specifically, no
hemorrhage, hydrocephalus, mass lesion, acute infarction, or
significant intracranial injury.

Vascular: No hyperdense vessel or unexpected calcification.

Skull: No acute calvarial abnormality.

Sinuses/Orbits: Visualized paranasal sinuses and mastoids clear.
Orbital soft tissues unremarkable.

Other: None
IMPRESSION: Severe diffuse cerebral atrophy and chronic small vessel disease. No
acute intracranial abnormality.
